# Patient Record
Sex: Male | Born: 1965
Health system: Southern US, Community
[De-identification: ages and names within clinical notes are randomized; demographics above are authoritative.]

## PROBLEM LIST (undated history)

## (undated) DIAGNOSIS — K529 Noninfective gastroenteritis and colitis, unspecified: Secondary | ICD-10-CM

## (undated) DIAGNOSIS — I7090 Unspecified atherosclerosis: Secondary | ICD-10-CM

## (undated) DIAGNOSIS — Z87442 Personal history of urinary calculi: Secondary | ICD-10-CM

## (undated) DIAGNOSIS — R7303 Prediabetes: Secondary | ICD-10-CM

## (undated) DIAGNOSIS — T7840XA Allergy, unspecified, initial encounter: Secondary | ICD-10-CM

## (undated) DIAGNOSIS — K219 Gastro-esophageal reflux disease without esophagitis: Secondary | ICD-10-CM

## (undated) DIAGNOSIS — E785 Hyperlipidemia, unspecified: Secondary | ICD-10-CM

## (undated) HISTORY — PX: LASIK: SHX215

## (undated) HISTORY — DX: Unspecified atherosclerosis: I70.90

## (undated) HISTORY — DX: Allergy, unspecified, initial encounter: T78.40XA

## (undated) HISTORY — DX: Hyperlipidemia, unspecified: E78.5

## (undated) HISTORY — PX: HEMORRHOID SURGERY: SHX153

---

## 2007-03-27 DIAGNOSIS — E782 Mixed hyperlipidemia: Secondary | ICD-10-CM | POA: Insufficient documentation

## 2011-06-18 DIAGNOSIS — E785 Hyperlipidemia, unspecified: Secondary | ICD-10-CM | POA: Insufficient documentation

## 2013-10-17 LAB — HEPATIC FUNCTION PANEL
ALT: 30 U/L (ref 10–40)
AST: 29 U/L (ref 14–40)
Alkaline Phosphatase: 103 U/L (ref 25–125)

## 2013-10-17 LAB — BASIC METABOLIC PANEL
BUN: 7 mg/dL (ref 4–21)
CREATININE: 1 mg/dL (ref 0.6–1.3)
Glucose: 109 mg/dL
POTASSIUM: 4.8 mmol/L (ref 3.4–5.3)
Sodium: 143 mmol/L (ref 137–147)

## 2013-10-17 LAB — TSH: TSH: 1.83 u[IU]/mL (ref 0.41–5.90)

## 2013-10-17 LAB — CBC AND DIFFERENTIAL
HCT: 46 % (ref 41–53)
Hemoglobin: 15.7 g/dL (ref 13.5–17.5)
Neutrophils Absolute: 3 /uL
PLATELETS: 209 10*3/uL (ref 150–399)
WBC: 5.9 10^3/mL

## 2013-10-17 LAB — PSA: PSA: 0.7

## 2014-01-10 ENCOUNTER — Other Ambulatory Visit (HOSPITAL_COMMUNITY): Payer: Self-pay | Admitting: Family Medicine

## 2014-01-10 DIAGNOSIS — Z8249 Family history of ischemic heart disease and other diseases of the circulatory system: Secondary | ICD-10-CM

## 2014-01-15 ENCOUNTER — Telehealth: Payer: Self-pay | Admitting: *Deleted

## 2014-01-15 DIAGNOSIS — R079 Chest pain, unspecified: Secondary | ICD-10-CM

## 2014-01-15 NOTE — Telephone Encounter (Signed)
Richardson Dopp >','<< Less Detail',event)" href="javascript:;">More Detail >>   Josue Hector, MD   Sent: Mon January 14, 2014 10:53 AM    To: Richmond Campbell, LPN; Terrilee Files        Message     Patient was scheduled for cardiac CTA at hospital this Friday. Dr Rosanna Randy only wanted calcium score. Please call patient and arrange exercise treadmill and calcium score in our office Can put ETT on my schedule if you need to. Patients phone numbers [phone]  [phone]      Scanned Document  01/10/2014 Scanned Document   Udell Mazzocco  MRN: [id number]         Encounter Information       Provider Department Encounter # Center    01/10/2014 4:49 PM Provider Default, MD Default Location [id number]          ALL SCANNED DOCUMENTS     Encounter-Level Documents - 01/10/14:       Scan on 01/14/2014 1:43 PM by Provider Default, MDScan on 01/14/2014 1:43 PM by Provider Default, MD     Scan on 01/11/2014 8:56 AM by Provider Default, MDScan on 01/11/2014 8:56 AM by Provider Default, MD     Scan on 01/10/2014 4:49 PM by Provider Default, MDScan on 01/10/2014 4:49 PM by Provider Default, MD     Order-Level Documents:     There are no order-level documents.     Patient-Level Documents:     There are no patient-level documents.     ORDERS ENTERED  WILL FORWARD  TO SCHEDULERS./CY

## 2014-01-18 ENCOUNTER — Ambulatory Visit (HOSPITAL_COMMUNITY): Payer: Managed Care, Other (non HMO)

## 2014-01-18 ENCOUNTER — Telehealth: Payer: Self-pay | Admitting: *Deleted

## 2014-01-18 NOTE — Telephone Encounter (Signed)
LMTCB ./CY 

## 2014-01-21 NOTE — Telephone Encounter (Signed)
LMTCB ./CY 

## 2014-01-22 NOTE — Telephone Encounter (Signed)
PT NEEDS TO  RESCHEDULE  TESTS  WILL BE OUT OF  TOWN  PLEASE [place]

## 2014-01-30 ENCOUNTER — Inpatient Hospital Stay: Admission: RE | Admit: 2014-01-30 | Payer: Managed Care, Other (non HMO) | Source: Ambulatory Visit

## 2014-01-30 ENCOUNTER — Encounter: Payer: Managed Care, Other (non HMO) | Admitting: Cardiovascular Disease

## 2014-02-06 ENCOUNTER — Encounter: Payer: Self-pay | Admitting: Cardiovascular Disease

## 2014-04-17 ENCOUNTER — Other Ambulatory Visit: Payer: Self-pay | Admitting: Family Medicine

## 2014-04-17 DIAGNOSIS — Z8249 Family history of ischemic heart disease and other diseases of the circulatory system: Secondary | ICD-10-CM

## 2014-04-17 LAB — HEMOGLOBIN A1C: HEMOGLOBIN A1C: 5.7 % (ref 4.0–6.0)

## 2014-04-17 LAB — LIPID PANEL
Cholesterol: 115 mg/dL (ref 0–200)
HDL: 29 mg/dL — AB (ref 35–70)
LDL CALC: 57 mg/dL
LDL/HDL RATIO: 2
Triglycerides: 145 mg/dL (ref 40–160)

## 2014-04-26 ENCOUNTER — Ambulatory Visit (HOSPITAL_COMMUNITY)
Admission: RE | Admit: 2014-04-26 | Discharge: 2014-04-26 | Disposition: A | Discharge status: Home / Self Care | Payer: Managed Care, Other (non HMO) | Source: Ambulatory Visit | Attending: Family Medicine | Admitting: Family Medicine

## 2014-04-26 DIAGNOSIS — R911 Solitary pulmonary nodule: Secondary | ICD-10-CM | POA: Insufficient documentation

## 2014-04-26 DIAGNOSIS — Z8249 Family history of ischemic heart disease and other diseases of the circulatory system: Secondary | ICD-10-CM | POA: Insufficient documentation

## 2014-10-23 DIAGNOSIS — R7303 Prediabetes: Secondary | ICD-10-CM | POA: Insufficient documentation

## 2014-10-23 DIAGNOSIS — J309 Allergic rhinitis, unspecified: Secondary | ICD-10-CM | POA: Insufficient documentation

## 2014-10-23 DIAGNOSIS — E663 Overweight: Secondary | ICD-10-CM | POA: Insufficient documentation

## 2014-10-24 ENCOUNTER — Encounter: Payer: Self-pay | Admitting: Family Medicine

## 2014-10-24 ENCOUNTER — Ambulatory Visit (INDEPENDENT_AMBULATORY_CARE_PROVIDER_SITE_OTHER): Payer: Managed Care, Other (non HMO) | Admitting: Family Medicine

## 2014-10-24 VITALS — BP 118/72 | Temp 98.5°F | Resp 16 | Ht 69.0 in | Wt 189.0 lb

## 2014-10-24 DIAGNOSIS — R7309 Other abnormal glucose: Secondary | ICD-10-CM

## 2014-10-24 DIAGNOSIS — Z1211 Encounter for screening for malignant neoplasm of colon: Secondary | ICD-10-CM

## 2014-10-24 DIAGNOSIS — D229 Melanocytic nevi, unspecified: Secondary | ICD-10-CM

## 2014-10-24 DIAGNOSIS — Z0000 Encounter for general adult medical examination without abnormal findings: Secondary | ICD-10-CM

## 2014-10-24 DIAGNOSIS — Z23 Encounter for immunization: Secondary | ICD-10-CM

## 2014-10-24 DIAGNOSIS — R7303 Prediabetes: Secondary | ICD-10-CM

## 2014-10-24 DIAGNOSIS — E785 Hyperlipidemia, unspecified: Secondary | ICD-10-CM | POA: Diagnosis not present

## 2014-10-24 NOTE — Progress Notes (Signed)
Patient ID: Richard Adkins, male   DOB: Jun 11, 1965, 49 y.o.   MRN: [id number]       Patient: Richard Adkins, Male    DOB: March 22, 1965, 49 y.o.   MRN: [id number] Visit Date: 10/24/2014  Today's Avigayil Ton: [name], MD   Chief Complaint  Patient presents with  . Annual Exam   Subjective:    Annual physical exam Richard Adkins is a 49 y.o. male who presents today for health maintenance and complete physical. He feels well. He reports exercising occasionally. He reports he is sleeping well.  Last: Tdap- 07/19/2007  Colonoscopy- never  EKG- 09/06/2012   -----------------------------------------------------------------   Review of Systems  Constitutional: Negative.   HENT: Negative.   Eyes: Negative.   Respiratory: Negative.   Cardiovascular: Negative.   Gastrointestinal: Negative.   Endocrine: Negative.   Genitourinary: Negative.   Musculoskeletal: Negative.   Skin: Negative.   Allergic/Immunologic: Positive for environmental allergies.  Neurological: Negative.   Hematological: Negative.   Psychiatric/Behavioral: Negative.     Social History He  reports that he has never smoked. He does not have any smokeless tobacco history on file. He reports that he does not drink alcohol or use illicit drugs. Social History   Social History  . Marital Status: Married    Spouse Name: N/A  . Number of Children: N/A  . Years of Education: N/A   Social History Main Topics  . Smoking status: Never Smoker   . Smokeless tobacco: None  . Alcohol Use: No  . Drug Use: No  . Sexual Activity: Not Asked   Other Topics Concern  . None   Social History Narrative    Patient Active Problem List   Diagnosis Date Noted  . Allergic rhinitis 10/23/2014  . Borderline diabetes 10/23/2014  . Overweight 10/23/2014  . Combined fat and carbohydrate induced hyperlipemia 03/27/2007  . Family history of cardiovascular disease 11/24/2006    Past Surgical History  Procedure Laterality Date  .  Lasik    . Hemorrhoid surgery      Family History  Family Status  Relation Status Death Age  . Mother Alive   . Father Deceased 54  . Brother Alive   . Brother Alive   . Brother Deceased 47   His family history includes Diabetes in his mother; Heart attack in his brother; Heart disease in his mother; Hyperlipidemia in his brother, brother, and mother; Hypertension in his mother; Sarcoidosis in his father.    No Known Allergies  Previous Medications   ASPIRIN EC 81 MG TABLET    Take by mouth.   ATORVASTATIN (LIPITOR) 40 MG TABLET    Take by mouth.   FLUTICASONE (FLONASE) 50 MCG/ACT NASAL SPRAY    Place into the nose.   MULTIPLE VITAMIN (MULTIVITAMIN) CAPSULE    Take by mouth.   NIACIN (NIASPAN) 1000 MG CR TABLET    2 (two) times daily.    OMEGA-3 1000 MG CAPS    Take by mouth.    Patient Care Team: Jerrol Banana., MD as PCP - General (Family Medicine)     Objective:   Vitals: BP 118/72 mmHg  Temp(Src) 98.5 F (36.9 C)  Resp 16  Ht 5' 9"  (1.753 m)  Wt 189 lb (85.73 kg)  BMI 27.90 kg/m2   Physical Exam  Constitutional: He is oriented to person, place, and time. He appears well-developed and well-nourished.  HENT:  Head: Normocephalic and atraumatic.  Right Ear: External ear normal.  Left Ear: External  ear normal.  Nose: Nose normal.  Mouth/Throat: Oropharynx is clear and moist.  Eyes: Conjunctivae and EOM are normal. Pupils are equal, round, and reactive to light.  Neck: Normal range of motion. Neck supple.  Cardiovascular: Normal rate, regular rhythm, normal heart sounds and intact distal pulses.   Pulmonary/Chest: Effort normal and breath sounds normal.  Abdominal: Soft.  Genitourinary: Penis normal. No penile tenderness.  Musculoskeletal: Normal range of motion. He exhibits no edema.  Neurological: He is alert and oriented to person, place, and time.  Straight leg raise positive on the right side.  Skin: Skin is warm and dry.  Psychiatric: He has a  normal mood and affect. His behavior is normal. Judgment and thought content normal.        Assessment & Plan:     Routine Health Maintenance and Physical Exam  Exercise Activities and Dietary recommendations Goals    None      Immunization History  Administered Date(s) Administered  . Tdap 07/19/2007    Health Maintenance  Topic Date Due  . HIV Screening  06/15/1979  . COLONOSCOPY  06/15/2014  . INFLUENZA VACCINE  09/02/2014  . TETANUS/TDAP  07/18/2017      Discussed health benefits of physical activity, and encouraged him to engage in regular exercise appropriate for his age and condition.   Right sciatica--will follow for now. --------------------------------------------------------------------

## 2014-10-25 LAB — COMPREHENSIVE METABOLIC PANEL
A/G RATIO: 2.1 (ref 1.1–2.5)
ALK PHOS: 114 IU/L (ref 39–117)
ALT: 36 IU/L (ref 0–44)
AST: 28 IU/L (ref 0–40)
Albumin: 4.7 g/dL (ref 3.5–5.5)
BILIRUBIN TOTAL: 1.2 mg/dL (ref 0.0–1.2)
BUN/Creatinine Ratio: 16 (ref 9–20)
BUN: 14 mg/dL (ref 6–24)
CALCIUM: 9.4 mg/dL (ref 8.7–10.2)
CHLORIDE: 103 mmol/L (ref 97–108)
CO2: 24 mmol/L (ref 18–29)
Creatinine, Ser: 0.87 mg/dL (ref 0.76–1.27)
GFR calc Af Amer: 116 mL/min/{1.73_m2} (ref >59)
GFR, EST NON AFRICAN AMERICAN: 101 mL/min/{1.73_m2} (ref >59)
Globulin, Total: 2.2 g/dL (ref 1.5–4.5)
Glucose: 111 mg/dL — ABNORMAL HIGH (ref 65–99)
POTASSIUM: 4.7 mmol/L (ref 3.5–5.2)
Sodium: 145 mmol/L — ABNORMAL HIGH (ref 134–144)
Total Protein: 6.9 g/dL (ref 6.0–8.5)

## 2014-10-25 LAB — CBC WITH DIFFERENTIAL/PLATELET
BASOS: 0 %
Basophils Absolute: 0 10*3/uL (ref 0.0–0.2)
EOS (ABSOLUTE): 0.2 10*3/uL (ref 0.0–0.4)
Eos: 2 %
Hematocrit: 46.9 % (ref 37.5–51.0)
Hemoglobin: 16 g/dL (ref 12.6–17.7)
IMMATURE GRANULOCYTES: 0 %
Immature Grans (Abs): 0 10*3/uL (ref 0.0–0.1)
Lymphocytes Absolute: 2.6 10*3/uL (ref 0.7–3.1)
Lymphs: 35 %
MCH: 30.8 pg (ref 26.6–33.0)
MCHC: 34.1 g/dL (ref 31.5–35.7)
MCV: 90 fL (ref 79–97)
MONOS ABS: 0.5 10*3/uL (ref 0.1–0.9)
Monocytes: 7 %
NEUTROS PCT: 56 %
Neutrophils Absolute: 4 10*3/uL (ref 1.4–7.0)
PLATELETS: 190 10*3/uL (ref 150–379)
RBC: 5.2 x10E6/uL (ref 4.14–5.80)
RDW: 14.1 % (ref 12.3–15.4)
WBC: 7.3 10*3/uL (ref 3.4–10.8)

## 2014-10-25 LAB — TSH: TSH: 1.86 u[IU]/mL (ref 0.450–4.500)

## 2014-10-25 LAB — HEMOGLOBIN A1C
Est. average glucose Bld gHb Est-mCnc: 120 mg/dL
HEMOGLOBIN A1C: 5.8 % — AB (ref 4.8–5.6)

## 2014-10-25 LAB — LIPID PANEL
CHOL/HDL RATIO: 4.5 ratio (ref 0.0–5.0)
CHOLESTEROL TOTAL: 138 mg/dL (ref 100–199)
HDL: 31 mg/dL — ABNORMAL LOW (ref >39)
LDL CALC: 80 mg/dL (ref 0–99)
Triglycerides: 136 mg/dL (ref 0–149)
VLDL CHOLESTEROL CAL: 27 mg/dL (ref 5–40)

## 2014-10-25 LAB — PSA: PROSTATE SPECIFIC AG, SERUM: 0.7 ng/mL (ref 0.0–4.0)

## 2014-10-28 ENCOUNTER — Telehealth: Payer: Self-pay

## 2014-10-28 NOTE — Telephone Encounter (Signed)
-----   Message from Jerrol Banana., MD sent at 10/28/2014  8:19 AM EDT ----- Labs stable

## 2014-10-28 NOTE — Telephone Encounter (Signed)
Advised  ED 

## 2014-10-29 ENCOUNTER — Encounter: Payer: Self-pay | Admitting: Family Medicine

## 2014-10-29 DIAGNOSIS — Z8249 Family history of ischemic heart disease and other diseases of the circulatory system: Secondary | ICD-10-CM

## 2014-11-19 NOTE — Addendum Note (Signed)
Encounter addended by: Jerrol Banana., MD on: 11/19/2014  8:00 AM<BR>     Documentation filed: Clinical Notes

## 2014-11-25 DIAGNOSIS — I2510 Atherosclerotic heart disease of native coronary artery without angina pectoris: Secondary | ICD-10-CM | POA: Insufficient documentation

## 2014-11-25 DIAGNOSIS — E782 Mixed hyperlipidemia: Secondary | ICD-10-CM | POA: Insufficient documentation

## 2014-12-02 ENCOUNTER — Telehealth: Payer: Self-pay | Admitting: Family Medicine

## 2014-12-02 NOTE — Telephone Encounter (Signed)
Pt stated he was referred to GI for a Colonoscopy and would rather go tho the doctor his wife sees. Pt would like to be referred to Dr. Arther Dames with [facility]. Phone# [phone] to [facility]. Please advise. Thanks TNP

## 2014-12-03 NOTE — Telephone Encounter (Signed)
Information faxed to Dr Rayann Heman.His office will contact pt

## 2014-12-16 NOTE — Telephone Encounter (Signed)
Nurse visit at Dr Jackalyn Lombard office 12-30-14 at 9:00

## 2015-02-02 DIAGNOSIS — I7090 Unspecified atherosclerosis: Secondary | ICD-10-CM

## 2015-02-02 HISTORY — DX: Unspecified atherosclerosis: I70.90

## 2015-02-13 ENCOUNTER — Other Ambulatory Visit: Payer: Self-pay | Admitting: Family Medicine

## 2015-04-28 ENCOUNTER — Ambulatory Visit (INDEPENDENT_AMBULATORY_CARE_PROVIDER_SITE_OTHER): Payer: Managed Care, Other (non HMO) | Admitting: Family Medicine

## 2015-04-28 ENCOUNTER — Encounter: Payer: Self-pay | Admitting: Family Medicine

## 2015-04-28 VITALS — BP 110/76 | HR 78 | Temp 98.3°F | Resp 14 | Wt 189.0 lb

## 2015-04-28 DIAGNOSIS — R739 Hyperglycemia, unspecified: Secondary | ICD-10-CM

## 2015-04-28 DIAGNOSIS — J309 Allergic rhinitis, unspecified: Secondary | ICD-10-CM

## 2015-04-28 DIAGNOSIS — I2510 Atherosclerotic heart disease of native coronary artery without angina pectoris: Secondary | ICD-10-CM | POA: Diagnosis not present

## 2015-04-28 DIAGNOSIS — E782 Mixed hyperlipidemia: Secondary | ICD-10-CM

## 2015-04-28 DIAGNOSIS — R7303 Prediabetes: Secondary | ICD-10-CM

## 2015-04-28 MED ORDER — NIACIN ER (ANTIHYPERLIPIDEMIC) 1000 MG PO TBCR: 1000.0000 mg | EXTENDED_RELEASE_TABLET | Freq: Every day | ORAL | Status: DC

## 2015-04-28 MED ORDER — ATORVASTATIN CALCIUM 80 MG PO TABS: 80.0000 mg | ORAL_TABLET | Freq: Every day | ORAL | Status: DC

## 2015-04-28 MED ORDER — FLUTICASONE PROPIONATE 50 MCG/ACT NA SUSP: 2.0000 | Freq: Every day | NASAL | Status: DC

## 2015-04-28 NOTE — Progress Notes (Signed)
Patient ID: Richard Adkins, male   DOB: 1966/01/13, 50 y.o.   MRN: [id number]    Subjective:  HPI  Lipid/Cholesterol, Follow-up:   Last seen for this6 months ago.  Management changes since that visit include Cardiologist increased Lipitor to 80 mg daily and cut his Niacin dosage in half. . Last Lipid Panel:    Component Value Date/Time   CHOL 138 10/24/2014 0957   CHOL 115 04/17/2014   TRIG 136 10/24/2014 0957   HDL 31* 10/24/2014 0957   HDL 29* 04/17/2014   CHOLHDL 4.5 10/24/2014 0957   LDLCALC 80 10/24/2014 0957   LDLCALC 57 04/17/2014    Risk factors for vascular disease include arteriosclerotic heart disease and hypercholesterolemia  He reports good compliance with treatment. He is not having side effects.  Current symptoms include none and have been unchanged. Weight trend: stable Current exercise: 1-2 times a week.  Wt Readings from Last 3 Encounters:  04/28/15 189 lb (85.73 kg)  10/24/14 189 lb (85.73 kg)  04/17/14 185 lb (83.915 kg)    ------------------------------------------------------------------- He will need refills on all his medication today.   Prior to Admission medications   Medication Sig Start Date End Date Taking? Authorizing Provider  aspirin EC 81 MG tablet Take by mouth.   Yes Historical Provider, MD  atorvastatin (LIPITOR) 80 MG tablet Take by mouth. 11/25/14 11/25/15 Yes Historical Provider, MD  fluticasone (FLONASE) 50 MCG/ACT nasal spray Place into the nose. 04/17/14  Yes Historical Provider, MD  Multiple Vitamin (MULTIVITAMIN) capsule Take by mouth.   Yes Historical Provider, MD  niacin (NIASPAN) 1000 MG CR tablet  11/25/14  Yes Historical Provider, MD  Omega-3 1000 MG CAPS Take by mouth.   Yes Historical Provider, MD    Patient Active Problem List   Diagnosis Date Noted  . Arteriosclerosis of coronary artery 11/25/2014  . Allergic rhinitis 10/23/2014  . Borderline diabetes 10/23/2014  . Overweight 10/23/2014  . Combined fat and  carbohydrate induced hyperlipemia 03/27/2007  . Family history of cardiovascular disease 11/24/2006    History reviewed. No pertinent past medical history.  Social History   Social History  . Marital Status: Married    Spouse Name: N/A  . Number of Children: N/A  . Years of Education: N/A   Occupational History  . Not on file.   Social History Main Topics  . Smoking status: Never Smoker   . Smokeless tobacco: Not on file  . Alcohol Use: No  . Drug Use: No  . Sexual Activity: Not on file   Other Topics Concern  . Not on file   Social History Narrative    No Known Allergies  Review of Systems  Constitutional: Negative.   HENT: Negative.   Eyes: Negative.   Respiratory: Negative.   Cardiovascular: Negative.   Gastrointestinal: Negative.   Genitourinary: Negative.   Musculoskeletal: Negative.   Skin: Negative.   Neurological: Negative.   Endo/Heme/Allergies: Negative.   Psychiatric/Behavioral: Negative.     Immunization History  Administered Date(s) Administered  . Influenza,inj,Quad PF,36+ Mos 10/24/2014  . Tdap 07/19/2007   Objective:  BP 110/76 mmHg  Pulse 78  Temp(Src) 98.3 F (36.8 C) (Oral)  Resp 14  Wt 189 lb (85.73 kg)  Physical Exam  Constitutional: He is oriented to person, place, and time and well-developed, well-nourished, and in no distress.  HENT:  Head: Normocephalic and atraumatic.  Right Ear: External ear normal.  Left Ear: External ear normal.  Nose: Nose normal.  Eyes: Conjunctivae and EOM  are normal. Pupils are equal, round, and reactive to light.  Neck: Normal range of motion. Neck supple.  Cardiovascular: Normal rate, regular rhythm, normal heart sounds and intact distal pulses.   Pulmonary/Chest: Effort normal and breath sounds normal.  Abdominal: Soft.  Musculoskeletal: Normal range of motion.  Neurological: He is alert and oriented to person, place, and time. He has normal reflexes. Gait normal. GCS score is 15.  Skin:  Skin is warm and dry.  Psychiatric: Mood, memory, affect and judgment normal.    Lab Results  Component Value Date   WBC 7.3 10/24/2014   HGB 15.7 10/17/2013   HCT 46.9 10/24/2014   PLT 190 10/24/2014   GLUCOSE 111* 10/24/2014   CHOL 138 10/24/2014   TRIG 136 10/24/2014   HDL 31* 10/24/2014   LDLCALC 80 10/24/2014   TSH 1.860 10/24/2014   PSA 0.7 10/17/2013   HGBA1C 5.8* 10/24/2014    CMP     Component Value Date/Time   NA 145* 10/24/2014 0957   K 4.7 10/24/2014 0957   CL 103 10/24/2014 0957   CO2 24 10/24/2014 0957   GLUCOSE 111* 10/24/2014 0957   BUN 14 10/24/2014 0957   CREATININE 0.87 10/24/2014 0957   CREATININE 1.0 10/17/2013   CALCIUM 9.4 10/24/2014 0957   PROT 6.9 10/24/2014 0957   ALBUMIN 4.7 10/24/2014 0957   AST 28 10/24/2014 0957   ALT 36 10/24/2014 0957   ALKPHOS 114 10/24/2014 0957   BILITOT 1.2 10/24/2014 0957   GFRNONAA 101 10/24/2014 0957   GFRAA 116 10/24/2014 0957    Assessment and Plan :  1. Combined fat and carbohydrate induced hyperlipemia  - CBC with Differential/Platelet - Lipid Panel With LDL/HDL Ratio - Comprehensive metabolic panel - atorvastatin (LIPITOR) 80 MG tablet; Take 1 tablet (80 mg total) by mouth daily.  Dispense: 90 tablet; Refill: 3 - niacin (NIASPAN) 1000 MG CR tablet; Take 1 tablet (1,000 mg total) by mouth daily.  Dispense: 90 tablet; Refill: 3  2.family history of CAD/coronary CT scan scores about 159/patient asymptomatic. Discussed this with cardiology and we'll aggressively treat all risk factors. Lipitor increased to maximum dose. Goal LDL less than 70 - Lipid Panel With LDL/HDL Ratio - Comprehensive metabolic panel  3. Borderline diabetes  - Hemoglobin A1c - TSH  4. Allergic rhinitis, unspecified allergic rhinitis type  - fluticasone (FLONASE) 50 MCG/ACT nasal spray; Place 2 sprays into both nostrils daily.  Dispense: 48 g; Refill: 3  5. Hyperglycemia  - TSH  Patient was seen and examined by Dr.  Miguel Aschoff, and noted scribed by Webb Laws, CMA I have done the exam and reviewed the above chart and it is accurate to the best of my knowledge.  Miguel Aschoff MD [facility] 04/28/2015 8:46 AM

## 2015-05-01 LAB — COMPREHENSIVE METABOLIC PANEL
ALBUMIN: 4.5 g/dL (ref 3.5–5.5)
ALK PHOS: 116 IU/L (ref 39–117)
ALT: 42 IU/L (ref 0–44)
AST: 50 IU/L — AB (ref 0–40)
Albumin/Globulin Ratio: 2 (ref 1.2–2.2)
BILIRUBIN TOTAL: 1.7 mg/dL — AB (ref 0.0–1.2)
BUN / CREAT RATIO: 16 (ref 9–20)
BUN: 14 mg/dL (ref 6–24)
CHLORIDE: 99 mmol/L (ref 96–106)
CO2: 25 mmol/L (ref 18–29)
Calcium: 9.1 mg/dL (ref 8.7–10.2)
Creatinine, Ser: 0.9 mg/dL (ref 0.76–1.27)
GFR calc Af Amer: 115 mL/min/{1.73_m2} (ref >59)
GFR calc non Af Amer: 99 mL/min/{1.73_m2} (ref >59)
GLOBULIN, TOTAL: 2.2 g/dL (ref 1.5–4.5)
GLUCOSE: 101 mg/dL — AB (ref 65–99)
Potassium: 4.9 mmol/L (ref 3.5–5.2)
SODIUM: 140 mmol/L (ref 134–144)
Total Protein: 6.7 g/dL (ref 6.0–8.5)

## 2015-05-01 LAB — CBC WITH DIFFERENTIAL/PLATELET
BASOS: 0 %
Basophils Absolute: 0 10*3/uL (ref 0.0–0.2)
EOS (ABSOLUTE): 0.1 10*3/uL (ref 0.0–0.4)
EOS: 2 %
HEMATOCRIT: 45.9 % (ref 37.5–51.0)
Hemoglobin: 15.5 g/dL (ref 12.6–17.7)
IMMATURE GRANULOCYTES: 0 %
Immature Grans (Abs): 0 10*3/uL (ref 0.0–0.1)
LYMPHS ABS: 2.3 10*3/uL (ref 0.7–3.1)
Lymphs: 38 %
MCH: 30.4 pg (ref 26.6–33.0)
MCHC: 33.8 g/dL (ref 31.5–35.7)
MCV: 90 fL (ref 79–97)
MONOS ABS: 0.5 10*3/uL (ref 0.1–0.9)
Monocytes: 9 %
NEUTROS ABS: 3.1 10*3/uL (ref 1.4–7.0)
Neutrophils: 51 %
PLATELETS: 215 10*3/uL (ref 150–379)
RBC: 5.1 x10E6/uL (ref 4.14–5.80)
RDW: 14 % (ref 12.3–15.4)
WBC: 6 10*3/uL (ref 3.4–10.8)

## 2015-05-01 LAB — LIPID PANEL WITH LDL/HDL RATIO
CHOLESTEROL TOTAL: 120 mg/dL (ref 100–199)
HDL: 31 mg/dL — ABNORMAL LOW (ref >39)
LDL CALC: 68 mg/dL (ref 0–99)
LDl/HDL Ratio: 2.2 ratio units (ref 0.0–3.6)
Triglycerides: 104 mg/dL (ref 0–149)
VLDL CHOLESTEROL CAL: 21 mg/dL (ref 5–40)

## 2015-05-01 LAB — HEMOGLOBIN A1C
Est. average glucose Bld gHb Est-mCnc: 120 mg/dL
Hgb A1c MFr Bld: 5.8 % — ABNORMAL HIGH (ref 4.8–5.6)

## 2015-05-01 LAB — TSH: TSH: 2.1 u[IU]/mL (ref 0.450–4.500)

## 2015-05-02 ENCOUNTER — Telehealth: Payer: Self-pay

## 2015-05-02 NOTE — Telephone Encounter (Signed)
Advised patient of results.  

## 2015-05-02 NOTE — Telephone Encounter (Signed)
-----   Message from Jerrol Banana., MD sent at 05/01/2015  8:09 AM EDT ----- Labs okay. Lipids better.

## 2015-05-02 NOTE — Telephone Encounter (Signed)
Left message to call back  

## 2015-05-14 ENCOUNTER — Encounter: Payer: Self-pay | Admitting: Family Medicine

## 2015-06-18 ENCOUNTER — Other Ambulatory Visit: Payer: Self-pay | Admitting: Family Medicine

## 2015-08-08 ENCOUNTER — Telehealth: Payer: Self-pay | Admitting: Family Medicine

## 2015-08-08 DIAGNOSIS — Z1211 Encounter for screening for malignant neoplasm of colon: Secondary | ICD-10-CM

## 2015-08-08 NOTE — Telephone Encounter (Signed)
Pt stated that he was supposed to get a colonoscopy September 2016 but the Dr. He had wanted to see moved out of the practice. Pt would like to get a referral to have a colonoscopy done wherever Dr. Rosanna Randy would recommend. Please advise. Thanks TNP

## 2015-08-11 NOTE — Telephone Encounter (Signed)
Please advise 

## 2015-08-14 NOTE — Telephone Encounter (Signed)
May need to consider a GI in [place].

## 2015-08-20 ENCOUNTER — Telehealth: Payer: Self-pay

## 2015-08-20 ENCOUNTER — Other Ambulatory Visit: Payer: Self-pay

## 2015-08-20 MED ORDER — NA SULFATE-K SULFATE-MG SULF 17.5-3.13-1.6 GM/177ML PO SOLN: 1.0000 | Freq: Once | ORAL | Status: DC

## 2015-08-20 NOTE — Telephone Encounter (Signed)
Gastroenterology Pre-Procedure Review  Request Date: 09/26/2015 Requesting Physician:   PATIENT REVIEW QUESTIONS: The patient responded to the following health history questions as indicated:    1. Are you having any GI issues? no 2. Do you have a personal history of Polyps? no 3. Do you have a family history of Colon Cancer or Polyps? no 4. Diabetes Mellitus? no 5. Joint replacements in the past 12 months?no 6. Major health problems in the past 3 months?no 7. Any artificial heart valves, MVP, or defibrillator?no    MEDICATIONS & ALLERGIES:    Patient reports the following regarding taking any anticoagulation/antiplatelet therapy:   Plavix, Coumadin, Eliquis, Xarelto, Lovenox, Pradaxa, Brilinta, or Effient? no Aspirin? yes (Heart health)  Patient confirms/reports the following medications:  Current Outpatient Prescriptions  Medication Sig Dispense Refill  . aspirin EC 81 MG tablet Take by mouth.    Marland Kitchen atorvastatin (LIPITOR) 80 MG tablet Take 1 tablet (80 mg total) by mouth daily. 90 tablet 3  . fluticasone (FLONASE) 50 MCG/ACT nasal spray Place 2 sprays into both nostrils daily. 48 g 3  . Multiple Vitamin (MULTIVITAMIN) capsule Take by mouth.    . niacin (NIASPAN) 1000 MG CR tablet TAKE 2 TABLETS BY MOUTH EVERY DAY WITH DINNER 180 tablet 2  . Omega-3 1000 MG CAPS Take by mouth.     No current facility-administered medications for this visit.    Patient confirms/reports the following allergies:  No Known Allergies  No orders of the defined types were placed in this encounter.    AUTHORIZATION INFORMATION Primary Insurance: 1D#: Group #:  Secondary Insurance: 1D#: Group #:  SCHEDULE INFORMATION: Date: 09/26/2015 Time: Location: [facility]

## 2015-09-24 ENCOUNTER — Encounter: Payer: Self-pay | Admitting: *Deleted

## 2015-09-25 NOTE — Discharge Instructions (Signed)

## 2015-09-26 ENCOUNTER — Ambulatory Visit
Admission: RE | Admit: 2015-09-26 | Discharge: 2015-09-26 | Disposition: A | Discharge status: Home / Self Care | Payer: Managed Care, Other (non HMO) | Source: Ambulatory Visit | Attending: Gastroenterology | Admitting: Gastroenterology

## 2015-09-26 ENCOUNTER — Ambulatory Visit: Payer: Managed Care, Other (non HMO) | Admitting: Anesthesiology

## 2015-09-26 ENCOUNTER — Encounter
Admission: RE | Disposition: A | Discharge status: Home / Self Care | Payer: Self-pay | Source: Ambulatory Visit | Attending: Gastroenterology

## 2015-09-26 DIAGNOSIS — D124 Benign neoplasm of descending colon: Secondary | ICD-10-CM | POA: Insufficient documentation

## 2015-09-26 DIAGNOSIS — Z833 Family history of diabetes mellitus: Secondary | ICD-10-CM | POA: Insufficient documentation

## 2015-09-26 DIAGNOSIS — Z7982 Long term (current) use of aspirin: Secondary | ICD-10-CM | POA: Diagnosis not present

## 2015-09-26 DIAGNOSIS — K6289 Other specified diseases of anus and rectum: Secondary | ICD-10-CM | POA: Insufficient documentation

## 2015-09-26 DIAGNOSIS — Z1211 Encounter for screening for malignant neoplasm of colon: Secondary | ICD-10-CM | POA: Insufficient documentation

## 2015-09-26 DIAGNOSIS — Z8249 Family history of ischemic heart disease and other diseases of the circulatory system: Secondary | ICD-10-CM | POA: Insufficient documentation

## 2015-09-26 DIAGNOSIS — Z8719 Personal history of other diseases of the digestive system: Secondary | ICD-10-CM | POA: Diagnosis not present

## 2015-09-26 DIAGNOSIS — Z79899 Other long term (current) drug therapy: Secondary | ICD-10-CM | POA: Diagnosis not present

## 2015-09-26 DIAGNOSIS — K621 Rectal polyp: Secondary | ICD-10-CM | POA: Insufficient documentation

## 2015-09-26 HISTORY — PX: POLYPECTOMY: SHX5525

## 2015-09-26 HISTORY — PX: COLONOSCOPY WITH PROPOFOL: SHX5780

## 2015-09-26 SURGERY — COLONOSCOPY WITH PROPOFOL
Anesthesia: Monitor Anesthesia Care | Wound class: Contaminated

## 2015-09-26 MED ORDER — SODIUM CHLORIDE 0.9 % IV SOLN: INTRAVENOUS | Status: DC

## 2015-09-26 MED ORDER — STERILE WATER FOR IRRIGATION IR SOLN
Status: DC | PRN
  Administered 2015-09-26: 08:00:00

## 2015-09-26 MED ORDER — PROPOFOL 10 MG/ML IV BOLUS
INTRAVENOUS | Status: DC | PRN
Start: 2015-09-26 — End: 2015-09-26
  Administered 2015-09-26: 50 mg via INTRAVENOUS
  Administered 2015-09-26: 100 mg via INTRAVENOUS
  Administered 2015-09-26 (×4): 50 mg via INTRAVENOUS

## 2015-09-26 MED ORDER — LACTATED RINGERS IV SOLN
INTRAVENOUS | Status: DC
  Administered 2015-09-26: 08:00:00 via INTRAVENOUS

## 2015-09-26 MED ORDER — LIDOCAINE HCL (CARDIAC) 20 MG/ML IV SOLN
INTRAVENOUS | Status: DC | PRN
  Administered 2015-09-26: 40 mg via INTRAVENOUS

## 2015-09-26 SURGICAL SUPPLY — 23 items
CANISTER SUCT 1200ML W/VALVE (MISCELLANEOUS) ×4 IMPLANT
CLIP HMST 235XBRD CATH ROT (MISCELLANEOUS) IMPLANT
CLIP RESOLUTION 360 11X235 (MISCELLANEOUS)
FCP ESCP3.2XJMB 240X2.8X (MISCELLANEOUS)
FORCEPS BIOP RAD 4 LRG CAP 4 (CUTTING FORCEPS) ×4 IMPLANT
FORCEPS BIOP RJ4 240 W/NDL (MISCELLANEOUS)
FORCEPS ESCP3.2XJMB 240X2.8X (MISCELLANEOUS) IMPLANT
GOWN CVR UNV OPN BCK APRN NK (MISCELLANEOUS) ×4 IMPLANT
GOWN ISOL THUMB LOOP REG UNIV (MISCELLANEOUS) ×4
INJECTOR VARIJECT VIN23 (MISCELLANEOUS) IMPLANT
KIT DEFENDO VALVE AND CONN (KITS) IMPLANT
KIT ENDO PROCEDURE OLY (KITS) ×4 IMPLANT
MARKER SPOT ENDO TATTOO 5ML (MISCELLANEOUS) IMPLANT
PAD GROUND ADULT SPLIT (MISCELLANEOUS) IMPLANT
PROBE APC STR FIRE (PROBE) IMPLANT
RETRIEVER NET ROTH 2.5X230 LF (MISCELLANEOUS) ×4 IMPLANT
SNARE SHORT THROW 13M SML OVAL (MISCELLANEOUS) IMPLANT
SNARE SHORT THROW 30M LRG OVAL (MISCELLANEOUS) IMPLANT
SNARE SNG USE RND 15MM (INSTRUMENTS) IMPLANT
SPOT EX ENDOSCOPIC TATTOO (MISCELLANEOUS)
TRAP ETRAP POLY (MISCELLANEOUS) IMPLANT
VARIJECT INJECTOR VIN23 (MISCELLANEOUS)
WATER STERILE IRR 250ML POUR (IV SOLUTION) ×4 IMPLANT

## 2015-09-26 NOTE — H&P (Signed)
  Lucilla Lame, MD [address] [place], [place] [address] Phone: [phone] Fax : [phone]  Primary Care Physician:  [name], MD Primary Gastroenterologist:  Dr. Allen Norris  Pre-Procedure History & Physical: HPI:  Richard Adkins is a 50 y.o. male is here for a screening colonoscopy.   History reviewed. No pertinent past medical history.  Past Surgical History:  Procedure Laterality Date  . HEMORRHOID SURGERY    . LASIK      Prior to Admission medications   Medication Sig Start Date End Date Taking? Authorizing Provider  aspirin EC 81 MG tablet Take by mouth.   Yes Historical Provider, MD  atorvastatin (LIPITOR) 80 MG tablet Take 1 tablet (80 mg total) by mouth daily. 04/28/15 04/27/16 Yes Richard L Cranford Mon., MD  fluticasone [facility]) 50 MCG/ACT nasal spray Place 2 sprays into both nostrils daily. 04/28/15  Yes Richard Maceo Pro., MD  Multiple Vitamin (MULTIVITAMIN) capsule Take by mouth.   Yes Historical Provider, MD  Na Sulfate-K Sulfate-Mg Sulf (SUPREP BOWEL PREP KIT) 17.5-3.13-1.6 GM/180ML SOLN Take 1 Dose by mouth once. 08/20/15  Yes Lucilla Lame, MD  niacin (NIASPAN) 1000 MG CR tablet TAKE 2 TABLETS BY MOUTH EVERY DAY WITH DINNER 06/19/15  Yes Richard Maceo Pro., MD  Omega-3 1000 MG CAPS Take by mouth.   Yes Historical Provider, MD  Probiotic Product (PROBIOTIC DAILY PO) Take by mouth.   Yes Historical Provider, MD    Allergies as of 08/20/2015  . (No Known Allergies)    Family History  Problem Relation Age of Onset  . Hyperlipidemia Mother   . Hypertension Mother   . Diabetes Mother   . Heart disease Mother   . Sarcoidosis Father   . Hyperlipidemia Brother   . Hyperlipidemia Brother   . Heart attack Brother     Social History   Social History  . Marital status: Married    Spouse name: N/A  . Number of children: N/A  . Years of education: N/A   Occupational History  . Not on file.   Social History Main Topics  . Smoking  status: Never Smoker  . Smokeless tobacco: Never Used  . Alcohol use Not on file  . Drug use: No  . Sexual activity: Not on file   Other Topics Concern  . Not on file   Social History Narrative  . No narrative on file    Review of Systems: See HPI, otherwise negative ROS  Physical Exam: BP 130/87   Pulse 79   Temp 98.7 F (37.1 C) (Temporal)   Resp 16   Ht 5' 9" (1.753 m)   Wt 182 lb (82.6 kg)   SpO2 98%   BMI 26.88 kg/m  General:   Alert,  pleasant and cooperative in NAD Head:  Normocephalic and atraumatic. Neck:  Supple; no masses or thyromegaly. Lungs:  Clear throughout to auscultation.    Heart:  Regular rate and rhythm. Abdomen:  Soft, nontender and nondistended. Normal bowel sounds, without guarding, and without rebound.   Neurologic:  Alert and  oriented x4;  grossly normal neurologically.  Impression/Plan: Richard Adkins is now here to undergo a screening colonoscopy.  Risks, benefits, and alternatives regarding colonoscopy have been reviewed with the patient.  Questions have been answered.  All parties agreeable.

## 2015-09-26 NOTE — Anesthesia Preprocedure Evaluation (Signed)
Anesthesia Evaluation  Patient identified by MRN, date of birth, ID band Patient awake    Airway Mallampati: I  TM Distance: >3 FB Neck ROM: Full    Dental   Pulmonary neg pulmonary ROS,    Pulmonary exam normal        Cardiovascular negative cardio ROS Normal cardiovascular exam     Neuro/Psych    GI/Hepatic   Endo/Other    Renal/GU      Musculoskeletal   Abdominal   Peds  Hematology   Anesthesia Other Findings   Reproductive/Obstetrics                             Anesthesia Physical Anesthesia Plan  ASA: II  Anesthesia Plan: MAC   Post-op Pain Management:    Induction: Intravenous  Airway Management Planned: Natural Airway  Additional Equipment:   Intra-op Plan:   Post-operative Plan:   Informed Consent: I have reviewed the patients History and Physical, chart, labs and discussed the procedure including the risks, benefits and alternatives for the proposed anesthesia with the patient or authorized representative who has indicated his/her understanding and acceptance.     Plan Discussed with: CRNA  Anesthesia Plan Comments:         Anesthesia Quick Evaluation

## 2015-09-26 NOTE — Anesthesia Postprocedure Evaluation (Signed)
Anesthesia Post Note  Patient: Richard Adkins  Procedure(s) Performed: Procedure(s) (LRB): COLONOSCOPY WITH PROPOFOL (N/A) POLYPECTOMY  Patient location during evaluation: PACU Anesthesia Type: MAC Level of consciousness: awake and alert Pain management: pain level controlled Vital Signs Assessment: post-procedure vital signs reviewed and stable Respiratory status: spontaneous breathing, nonlabored ventilation, respiratory function stable and patient connected to nasal cannula oxygen Cardiovascular status: stable and blood pressure returned to baseline Anesthetic complications: no    Marshell Levan

## 2015-09-26 NOTE — Op Note (Signed)
[facility] Gastroenterology Patient Name: Richard Adkins Procedure Date: 09/26/2015 8:15 AM MRN: [id number] Account #: [id number] Date of Birth: 1965-04-09 Admit Type: Outpatient Age: 50 Room: [facility] OR ROOM 01 Gender: Male Note Status: Finalized Procedure:            Colonoscopy Indications:          Screening for colorectal malignant neoplasm Providers:            Lucilla Lame MD, MD Referring MD:         Janine Ores. Rosanna Randy, MD (Referring MD) Medicines:            Propofol per Anesthesia Complications:        No immediate complications. Procedure:            Pre-Anesthesia Assessment:                       - Prior to the procedure, a History and Physical was                        performed, and patient medications and allergies were                        reviewed. The patient's tolerance of previous                        anesthesia was also reviewed. The risks and benefits of                        the procedure and the sedation options and risks were                        discussed with the patient. All questions were                        answered, and informed consent was obtained. Prior                        Anticoagulants: The patient has taken no previous                        anticoagulant or antiplatelet agents. ASA Grade                        Assessment: II - A patient with mild systemic disease.                        After reviewing the risks and benefits, the patient was                        deemed in satisfactory condition to undergo the                        procedure.                       After obtaining informed consent, the colonoscope was                        passed under direct vision. Throughout the procedure,  the patient's blood pressure, pulse, and oxygen                        saturations were monitored continuously. The Olympus CF                        H180AL colonoscope (S#: [id number]) was introduced  through                        the anus and advanced to the the cecum, identified by                        appendiceal orifice and ileocecal valve. The                        colonoscopy was performed without difficulty. The                        patient tolerated the procedure well. The quality of                        the bowel preparation was excellent. Findings:      The perianal and digital rectal examinations were normal.      A 2 mm polyp was found in the descending colon. The polyp was sessile.       The polyp was removed with a cold biopsy forceps. Resection and       retrieval were complete.      A 3 mm polyp was found in the rectum. The polyp was sessile. The polyp       was removed with a cold biopsy forceps. Resection and retrieval were       complete.      A 9 mm polyp was found in the rectum. The polyp was semi-pedunculated.       This was biopsied with a cold forceps for histology.      Localized moderate inflammation characterized by aphthous ulcerations       was found in the rectum. Biopsies were taken with a cold forceps for       histology. Impression:           - One 2 mm polyp in the descending colon, removed with                        a cold biopsy forceps. Resected and retrieved.                       - One 3 mm polyp in the rectum, removed with a cold                        biopsy forceps. Resected and retrieved.                       - One 9 mm polyp in the rectum. Biopsied.                       - Localized moderate inflammation was found in the                        rectum secondary to proctitis. Biopsied. Recommendation:       -  Await pathology results.                       - Repeat colonoscopy in 5 years if polyp adenoma and 10                        years if hyperplastic Procedure Code(s):    --- Professional ---                       [phone], Colonoscopy, flexible; with biopsy, single or                        multiple Diagnosis Code(s):    ---  Professional ---                       Z12.11, Encounter for screening for malignant neoplasm                        of colon                       D12.4, Benign neoplasm of descending colon                       K62.1, Rectal polyp CPT copyright 2016 American Medical Association. All rights reserved. The codes documented in this report are preliminary and upon coder review may  be revised to meet current compliance requirements. Lucilla Lame MD, MD 09/26/2015 8:41:13 AM This report has been signed electronically. Number of Addenda: 0 Note Initiated On: 09/26/2015 8:15 AM Scope Withdrawal Time: 0 hours 10 minutes 16 seconds  Total Procedure Duration: 0 hours 13 minutes 48 seconds       [facility]

## 2015-09-26 NOTE — Transfer of Care (Signed)
Immediate Anesthesia Transfer of Care Note  Patient: Richard Adkins  Procedure(s) Performed: Procedure(s) with comments: COLONOSCOPY WITH PROPOFOL (N/A) - requests early POLYPECTOMY  Patient Location: PACU  Anesthesia Type: MAC  Level of Consciousness: awake, alert  and patient cooperative  Airway and Oxygen Therapy: Patient Spontanous Breathing and Patient connected to supplemental oxygen  Post-op Assessment: Post-op Vital signs reviewed, Patient's Cardiovascular Status Stable, Respiratory Function Stable, Patent Airway and No signs of Nausea or vomiting  Post-op Vital Signs: Reviewed and stable  Complications: No apparent anesthesia complications

## 2015-09-26 NOTE — Anesthesia Procedure Notes (Signed)
Procedure Name: MAC Performed by: Amanuel Sinkfield Pre-anesthesia Checklist: Patient identified, Emergency Drugs available, Suction available, Timeout performed and Patient being monitored Patient Re-evaluated:Patient Re-evaluated prior to inductionOxygen Delivery Method: Nasal cannula Placement Confirmation: positive ETCO2     

## 2015-09-29 ENCOUNTER — Encounter: Payer: Self-pay | Admitting: Gastroenterology

## 2015-09-30 ENCOUNTER — Encounter: Payer: Self-pay | Admitting: Gastroenterology

## 2015-10-02 ENCOUNTER — Telehealth: Payer: Self-pay

## 2015-10-02 NOTE — Telephone Encounter (Signed)
Pt scheduled for a follow up appt with Dr. Allen Norris on Wednesday, Sept 6th.

## 2015-10-02 NOTE — Telephone Encounter (Signed)
-----   Message from Lucilla Lame, MD sent at 10/02/2015 12:13 PM EDT ----- Please have the patient come in for a follow up.

## 2015-10-08 ENCOUNTER — Encounter: Payer: Self-pay | Admitting: Gastroenterology

## 2015-10-08 ENCOUNTER — Ambulatory Visit (INDEPENDENT_AMBULATORY_CARE_PROVIDER_SITE_OTHER): Payer: Managed Care, Other (non HMO) | Admitting: Gastroenterology

## 2015-10-08 VITALS — BP 128/87 | HR 76 | Temp 98.3°F | Ht 69.0 in | Wt 188.0 lb

## 2015-10-08 DIAGNOSIS — K5120 Ulcerative (chronic) proctitis without complications: Secondary | ICD-10-CM | POA: Diagnosis not present

## 2015-10-08 NOTE — Progress Notes (Signed)
   Primary Care Physician: [name], MD  Primary Gastroenterologist:  Dr. Lucilla Lame  Chief Complaint  Patient presents with  . Follow-up    Discuss Colonoscopy results    HPI: Richard Adkins is a 50 y.o. male here for follow-up after having a colonoscopy. The patient was having a screening colonoscopy although he reports that he has intermittent bowel problems throughout his life. The patient was found to have ulcerative proctitis without any abnormalities and the rest the colon.  Current Outpatient Prescriptions  Medication Sig Dispense Refill  . aspirin EC 81 MG tablet Take by mouth.    Marland Kitchen aspirin EC 81 MG tablet Take by mouth.    Marland Kitchen atorvastatin (LIPITOR) 80 MG tablet Take 1 tablet (80 mg total) by mouth daily. 90 tablet 3  . DOCOSAHEXAENOIC ACID PO Take by mouth.    . fluticasone (FLONASE) 50 MCG/ACT nasal spray Place 2 sprays into both nostrils daily. 48 g 3  . Multiple Vitamin (MULTIVITAMIN) capsule Take by mouth.    . Multiple Vitamin (MULTIVITAMIN) capsule Take by mouth.    . Na Sulfate-K Sulfate-Mg Sulf (SUPREP BOWEL PREP KIT) 17.5-3.13-1.6 GM/180ML SOLN Take 1 Dose by mouth once. 1 Bottle 0  . niacin (NIASPAN) 1000 MG CR tablet TAKE 2 TABLETS BY MOUTH EVERY DAY WITH DINNER 180 tablet 2  . niacin (NIASPAN) 1000 MG CR tablet daily with snack at bedtime.    . Omega-3 1000 MG CAPS Take by mouth.    . Probiotic Product (PROBIOTIC DAILY PO) Take by mouth.     No current facility-administered medications for this visit.     Allergies as of 10/08/2015  . (No Known Allergies)    ROS:  General: Negative for anorexia, weight loss, fever, chills, fatigue, weakness. ENT: Negative for hoarseness, difficulty swallowing , nasal congestion. CV: Negative for chest pain, angina, palpitations, dyspnea on exertion, peripheral edema.  Respiratory: Negative for dyspnea at rest, dyspnea on exertion, cough, sputum, wheezing.  GI: See history of present illness. GU:  Negative for  dysuria, hematuria, urinary incontinence, urinary frequency, nocturnal urination.  Endo: Negative for unusual weight change.    Physical Examination:   BP 128/87 (BP Location: Right Arm, Patient Position: Sitting, Cuff Size: Normal)   Pulse 76   Temp 98.3 F (36.8 C) (Oral)   Ht _0  (1.753 m)   Wt 188 lb (85.3 kg)   BMI 27.76 kg/m   General: Well-nourished, well-developed in no acute distress.  Eyes: No icterus. Conjunctivae pink. Neuro: Alert and oriented x 3.  Grossly intact. Skin: Warm and dry, no jaundice.   Psych: Alert and cooperative, normal mood and affect.  Labs:    Imaging Studies: No results found.  Assessment and Plan:   Richard Adkins is a 50 y.o. y/o male who was found to have ulcerative proctitis with chronic inflammation on his screening colonoscopy. The patient will be given samples of Canasa for the next 3 weeks to be taken once per day. The patient has been explained to use them right before he goes to sleep. The patient will inform me if he feels any difference. The patient will need a repeat flexible sigmoidoscopy in 5 years for surveillance.   Note: This dictation was prepared with Dragon dictation along with smaller phrase technology. Any transcriptional errors that result from this process are unintentional.

## 2015-10-27 ENCOUNTER — Encounter: Payer: Managed Care, Other (non HMO) | Admitting: Family Medicine

## 2015-12-11 ENCOUNTER — Encounter: Payer: Managed Care, Other (non HMO) | Admitting: Family Medicine

## 2016-01-30 ENCOUNTER — Other Ambulatory Visit: Payer: Self-pay

## 2016-01-30 ENCOUNTER — Telehealth: Payer: Self-pay | Admitting: Gastroenterology

## 2016-01-30 MED ORDER — MESALAMINE 1000 MG RE SUPP: 1000.0000 mg | Freq: Every day | RECTAL | 3 refills | Status: DC

## 2016-01-30 NOTE — Telephone Encounter (Signed)
Patient needs a RX for Canasa 1086m. He is beginning to have a problem again. [facility] [address] [facility]  [phone] phone [phone]

## 2016-01-30 NOTE — Telephone Encounter (Signed)
Pt notified rx was sent to [place], [place].

## 2016-02-16 ENCOUNTER — Other Ambulatory Visit: Payer: Self-pay | Admitting: Family Medicine

## 2016-02-16 DIAGNOSIS — E782 Mixed hyperlipidemia: Secondary | ICD-10-CM

## 2016-02-16 MED ORDER — ATORVASTATIN CALCIUM 80 MG PO TABS: 80.0000 mg | ORAL_TABLET | Freq: Every day | ORAL | 3 refills | Status: DC

## 2016-02-16 NOTE — Telephone Encounter (Signed)
Pt contacted office for refill request on the following medications:  atorvastatin (LIPITOR) 80 MG tablet.  [facility] [address] [facility].  CB# [phone]

## 2016-02-16 NOTE — Telephone Encounter (Signed)
Done-aa

## 2016-03-03 ENCOUNTER — Ambulatory Visit (INDEPENDENT_AMBULATORY_CARE_PROVIDER_SITE_OTHER): Payer: Managed Care, Other (non HMO) | Admitting: Family Medicine

## 2016-03-03 ENCOUNTER — Encounter: Payer: Self-pay | Admitting: Family Medicine

## 2016-03-03 VITALS — BP 118/68 | HR 74 | Temp 98.8°F | Resp 16 | Ht 69.0 in | Wt 182.0 lb

## 2016-03-03 DIAGNOSIS — Z0000 Encounter for general adult medical examination without abnormal findings: Secondary | ICD-10-CM

## 2016-03-03 DIAGNOSIS — R7303 Prediabetes: Secondary | ICD-10-CM

## 2016-03-03 DIAGNOSIS — E782 Mixed hyperlipidemia: Secondary | ICD-10-CM

## 2016-03-03 DIAGNOSIS — Z23 Encounter for immunization: Secondary | ICD-10-CM | POA: Diagnosis not present

## 2016-03-03 DIAGNOSIS — Z125 Encounter for screening for malignant neoplasm of prostate: Secondary | ICD-10-CM | POA: Diagnosis not present

## 2016-03-03 LAB — POCT URINALYSIS DIPSTICK
Bilirubin, UA: NEGATIVE
Blood, UA: NEGATIVE
GLUCOSE UA: NEGATIVE
KETONES UA: NEGATIVE
LEUKOCYTES UA: NEGATIVE
Nitrite, UA: NEGATIVE
Protein, UA: NEGATIVE
SPEC GRAV UA: 1.025
UROBILINOGEN UA: 0.2
pH, UA: 6

## 2016-03-03 NOTE — Progress Notes (Signed)
Patient: Richard Adkins, Male    DOB: 05/15/1965, 51 y.o.   MRN: [id number] Visit Date: 03/03/2016  Today's Provider: [name], MD   Chief Complaint  Patient presents with  . Annual Exam   Subjective:    Annual physical exam Richard Adkins is a 51 y.o. male who presents today for health maintenance and complete physical. He feels well. He reports exercising occasionally. He reports he is sleeping well. Sleeps on average 6-8 hours. Patient doing well. He has children that are now 56 and 14. Colonoscopy- 09/26/2015. Hyperplastic polyps. Repeat in 5 years.      Review of Systems  Constitutional: Negative.   HENT: Negative.   Eyes: Negative.   Respiratory: Negative.   Cardiovascular: Negative.   Gastrointestinal: Negative.   Endocrine: Negative.   Genitourinary: Negative.   Musculoskeletal: Negative.   Skin: Negative.   Allergic/Immunologic: Negative.   Neurological: Negative.   Hematological: Negative.   Psychiatric/Behavioral: Negative.     Social History      He  reports that he has never smoked. He has never used smokeless tobacco. He reports that he does not use drugs.       Social History   Social History  . Marital status: Married    Spouse name: N/A  . Number of children: N/A  . Years of education: N/A   Social History Main Topics  . Smoking status: Never Smoker  . Smokeless tobacco: Never Used  . Alcohol use None  . Drug use: No  . Sexual activity: Not Asked   Other Topics Concern  . None   Social History Narrative  . None    Past Medical History:  Diagnosis Date  . Allergy   . Arterioloscleroses 2017  . Hyperlipidemia      Patient Active Problem List   Diagnosis Date Noted  . Arteriosclerosis of coronary artery 11/25/2014  . Mixed hyperlipidemia 11/25/2014  . Allergic rhinitis 10/23/2014  . Borderline diabetes 10/23/2014  . Overweight 10/23/2014  . Dyslipidemia 06/18/2011  . Combined fat and carbohydrate induced  hyperlipemia 03/27/2007  . Family history of cardiovascular disease 11/24/2006    Past Surgical History:  Procedure Laterality Date  . COLONOSCOPY WITH PROPOFOL N/A 09/26/2015   Procedure: COLONOSCOPY WITH PROPOFOL;  Surgeon: Lucilla Lame, MD;  Location: [place];  Service: Endoscopy;  Laterality: N/A;  requests early  . HEMORRHOID SURGERY    . LASIK    . POLYPECTOMY  09/26/2015   Procedure: POLYPECTOMY;  Surgeon: Lucilla Lame, MD;  Location: [place];  Service: Endoscopy;;    Family History        Family Status  Relation Status  . Mother Alive  . Father Deceased at age 80  . Brother Alive  . Brother Alive  . Brother Deceased at age 52        His family history includes Diabetes in his mother; Heart attack in his brother; Heart disease in his mother; Hyperlipidemia in his brother, brother, and mother; Hypertension in his mother; Sarcoidosis in his father.     No Known Allergies   Current Outpatient Prescriptions:  .  aspirin EC 81 MG tablet, Take by mouth., Disp: , Rfl:  .  atorvastatin (LIPITOR) 80 MG tablet, Take 1 tablet (80 mg total) by mouth daily., Disp: 90 tablet, Rfl: 3 .  fluticasone (FLONASE) 50 MCG/ACT nasal spray, Place 2 sprays into both nostrils daily., Disp: 48 g, Rfl: 3 .  Multiple Vitamin (MULTIVITAMIN) capsule,  Take by mouth., Disp: , Rfl:  .  niacin (NIASPAN) 1000 MG CR tablet, TAKE 2 TABLETS BY MOUTH EVERY DAY WITH DINNER, Disp: 180 tablet, Rfl: 2 .  niacin (NIASPAN) 1000 MG CR tablet, daily with snack at bedtime., Disp: , Rfl:  .  Omega-3 1000 MG CAPS, Take by mouth., Disp: , Rfl:  .  Probiotic Product (PROBIOTIC DAILY PO), Take by mouth., Disp: , Rfl:  .  aspirin EC 81 MG tablet, Take by mouth., Disp: , Rfl:  .  DOCOSAHEXAENOIC ACID PO, Take by mouth., Disp: , Rfl:  .  mesalamine (CANASA) 1000 MG suppository, Place 1 suppository (1,000 mg total) rectally at bedtime., Disp: 30 suppository, Rfl: 3 .  Multiple Vitamin (MULTIVITAMIN)  capsule, Take by mouth., Disp: , Rfl:  .  Na Sulfate-K Sulfate-Mg Sulf (SUPREP BOWEL PREP KIT) 17.5-3.13-1.6 GM/180ML SOLN, Take 1 Dose by mouth once., Disp: 1 Bottle, Rfl: 0   Patient Care Team: Richard Adkins., MD as PCP - General (Family Medicine)      Objective:   Vitals: BP 118/68 (BP Location: Right Arm, Patient Position: Sitting, Cuff Size: Normal)   Pulse 74   Temp 98.8 F (37.1 C)   Resp 16   Ht 5' 9" (1.753 m)   Wt 182 lb (82.6 kg)   BMI 26.88 kg/m    Physical Exam  Constitutional: He is oriented to person, place, and time. He appears well-developed and well-nourished.  HENT:  Head: Normocephalic and atraumatic.  Right Ear: External ear normal.  Left Ear: External ear normal.  Nose: Nose normal.  Mouth/Throat: Oropharynx is clear and moist.  Eyes: Conjunctivae and EOM are normal. Pupils are equal, round, and reactive to light.  Neck: Normal range of motion. Neck supple.  Cardiovascular: Normal rate, regular rhythm and normal heart sounds.   Pulmonary/Chest: Effort normal and breath sounds normal.  Abdominal: Soft. Bowel sounds are normal.  Musculoskeletal: Normal range of motion.  Neurological: He is alert and oriented to person, place, and time.  Skin: Skin is warm and dry.  Psychiatric: He has a normal mood and affect. His behavior is normal. Judgment and thought content normal.     Depression Screen No flowsheet data found.    Assessment & Plan:     Routine Health Maintenance and Physical Exam  Exercise Activities and Dietary recommendations Goals    None      Immunization History  Administered Date(s) Administered  . Influenza,inj,Quad PF,36+ Mos 10/24/2014  . Tdap 07/19/2007    Health Maintenance  Topic Date Due  . HIV Screening  06/15/1979  . INFLUENZA VACCINE  09/02/2015  . TETANUS/TDAP  07/18/2017  . COLONOSCOPY  09/25/2025     Discussed health benefits of physical activity, and encouraged him to engage in regular exercise  appropriate for his age and condition.  Colonoscopy last year revealed an ulcerative proctitis Hyperlipidemia--cut back on niacin to 1000 mg daily and increase atorvastatin 80 mg daily.       Richard Gilbert Jr, MD  [facility]  

## 2016-03-04 LAB — COMPREHENSIVE METABOLIC PANEL
A/G RATIO: 1.9 (ref 1.2–2.2)
ALT: 41 IU/L (ref 0–44)
AST: 32 IU/L (ref 0–40)
Albumin: 4.8 g/dL (ref 3.5–5.5)
Alkaline Phosphatase: 114 IU/L (ref 39–117)
BUN/Creatinine Ratio: 18 (ref 9–20)
BUN: 16 mg/dL (ref 6–24)
Bilirubin Total: 1.9 mg/dL — ABNORMAL HIGH (ref 0.0–1.2)
CALCIUM: 9.5 mg/dL (ref 8.7–10.2)
CO2: 28 mmol/L (ref 18–29)
Chloride: 99 mmol/L (ref 96–106)
Creatinine, Ser: 0.9 mg/dL (ref 0.76–1.27)
GFR, EST AFRICAN AMERICAN: 114 mL/min/{1.73_m2} (ref >59)
GFR, EST NON AFRICAN AMERICAN: 99 mL/min/{1.73_m2} (ref >59)
GLOBULIN, TOTAL: 2.5 g/dL (ref 1.5–4.5)
Glucose: 107 mg/dL — ABNORMAL HIGH (ref 65–99)
POTASSIUM: 4.9 mmol/L (ref 3.5–5.2)
SODIUM: 142 mmol/L (ref 134–144)
Total Protein: 7.3 g/dL (ref 6.0–8.5)

## 2016-03-04 LAB — CBC WITH DIFFERENTIAL/PLATELET
BASOS: 0 %
Basophils Absolute: 0 10*3/uL (ref 0.0–0.2)
EOS (ABSOLUTE): 0.1 10*3/uL (ref 0.0–0.4)
EOS: 2 %
HEMATOCRIT: 47.2 % (ref 37.5–51.0)
Hemoglobin: 15.8 g/dL (ref 13.0–17.7)
IMMATURE GRANULOCYTES: 0 %
Immature Grans (Abs): 0 10*3/uL (ref 0.0–0.1)
LYMPHS ABS: 2.1 10*3/uL (ref 0.7–3.1)
Lymphs: 34 %
MCH: 30.6 pg (ref 26.6–33.0)
MCHC: 33.5 g/dL (ref 31.5–35.7)
MCV: 92 fL (ref 79–97)
MONOS ABS: 0.6 10*3/uL (ref 0.1–0.9)
Monocytes: 9 %
NEUTROS ABS: 3.5 10*3/uL (ref 1.4–7.0)
NEUTROS PCT: 55 %
Platelets: 219 10*3/uL (ref 150–379)
RBC: 5.16 x10E6/uL (ref 4.14–5.80)
RDW: 13.8 % (ref 12.3–15.4)
WBC: 6.3 10*3/uL (ref 3.4–10.8)

## 2016-03-04 LAB — HEMOGLOBIN A1C
Est. average glucose Bld gHb Est-mCnc: 117 mg/dL
HEMOGLOBIN A1C: 5.7 % — AB (ref 4.8–5.6)

## 2016-03-04 LAB — LIPID PANEL
CHOL/HDL RATIO: 4.5 ratio (ref 0.0–5.0)
Cholesterol, Total: 135 mg/dL (ref 100–199)
HDL: 30 mg/dL — ABNORMAL LOW (ref >39)
LDL Calculated: 66 mg/dL (ref 0–99)
TRIGLYCERIDES: 195 mg/dL — AB (ref 0–149)
VLDL Cholesterol Cal: 39 mg/dL (ref 5–40)

## 2016-03-04 LAB — PSA: PROSTATE SPECIFIC AG, SERUM: 0.6 ng/mL (ref 0.0–4.0)

## 2016-03-04 LAB — TSH: TSH: 1.81 u[IU]/mL (ref 0.450–4.500)

## 2016-03-04 NOTE — Progress Notes (Signed)
Advised  ED 

## 2016-07-05 ENCOUNTER — Other Ambulatory Visit: Payer: Self-pay | Admitting: Family Medicine

## 2016-07-05 MED ORDER — FLUTICASONE PROPIONATE 50 MCG/ACT NA SUSP: 2.0000 | Freq: Every day | NASAL | 12 refills | Status: DC

## 2016-07-05 NOTE — Telephone Encounter (Signed)
Pt contacted office for refill request on the following medications:  fluticasone (FLONASE) 50 MCG/ACT nasal spray.  [facility].  SF#[phone]  This is a pt of Dr Wilmon Arms

## 2016-08-15 ENCOUNTER — Other Ambulatory Visit: Payer: Self-pay | Admitting: Family Medicine

## 2017-02-15 ENCOUNTER — Other Ambulatory Visit: Payer: Self-pay | Admitting: Family Medicine

## 2017-02-15 DIAGNOSIS — E782 Mixed hyperlipidemia: Secondary | ICD-10-CM

## 2017-02-15 MED ORDER — ATORVASTATIN CALCIUM 80 MG PO TABS: 80.0000 mg | ORAL_TABLET | Freq: Every day | ORAL | 3 refills | Status: DC

## 2017-02-15 NOTE — Telephone Encounter (Signed)
[facility] supply for the following medication. Thanks [facility]  atorvastatin (LIPITOR) 80 MG tablet

## 2017-02-19 ENCOUNTER — Other Ambulatory Visit: Payer: Self-pay | Admitting: Family Medicine

## 2017-02-19 DIAGNOSIS — E782 Mixed hyperlipidemia: Secondary | ICD-10-CM

## 2017-03-08 ENCOUNTER — Encounter: Payer: Self-pay | Admitting: Family Medicine

## 2017-03-08 ENCOUNTER — Other Ambulatory Visit: Payer: Self-pay

## 2017-03-08 ENCOUNTER — Ambulatory Visit (INDEPENDENT_AMBULATORY_CARE_PROVIDER_SITE_OTHER): Payer: 59 | Admitting: Family Medicine

## 2017-03-08 VITALS — BP 122/84 | HR 68 | Temp 98.4°F | Resp 16 | Ht 69.0 in | Wt 192.0 lb

## 2017-03-08 DIAGNOSIS — Z1211 Encounter for screening for malignant neoplasm of colon: Secondary | ICD-10-CM | POA: Diagnosis not present

## 2017-03-08 DIAGNOSIS — Z0000 Encounter for general adult medical examination without abnormal findings: Secondary | ICD-10-CM

## 2017-03-08 DIAGNOSIS — R7309 Other abnormal glucose: Secondary | ICD-10-CM

## 2017-03-08 DIAGNOSIS — Z125 Encounter for screening for malignant neoplasm of prostate: Secondary | ICD-10-CM

## 2017-03-08 DIAGNOSIS — J309 Allergic rhinitis, unspecified: Secondary | ICD-10-CM | POA: Diagnosis not present

## 2017-03-08 DIAGNOSIS — E782 Mixed hyperlipidemia: Secondary | ICD-10-CM

## 2017-03-08 LAB — POCT URINALYSIS DIPSTICK
BILIRUBIN UA: NEGATIVE
Blood, UA: NEGATIVE
Glucose, UA: NEGATIVE
KETONES UA: NEGATIVE
Leukocytes, UA: NEGATIVE
NITRITE UA: NEGATIVE
PH UA: 6 (ref 5.0–8.0)
PROTEIN UA: NEGATIVE
Spec Grav, UA: 1.01 (ref 1.010–1.025)
UROBILINOGEN UA: 0.2 U/dL

## 2017-03-08 LAB — IFOBT (OCCULT BLOOD): IFOBT: NEGATIVE

## 2017-03-08 MED ORDER — ATORVASTATIN CALCIUM 80 MG PO TABS: 80.0000 mg | ORAL_TABLET | Freq: Every day | ORAL | 3 refills | Status: DC

## 2017-03-08 MED ORDER — ICOSAPENT ETHYL 1 G PO CAPS: 2.0000 g | ORAL_CAPSULE | Freq: Two times a day (BID) | ORAL | 12 refills | Status: DC

## 2017-03-08 MED ORDER — FLUTICASONE PROPIONATE 50 MCG/ACT NA SUSP: 2.0000 | Freq: Every day | NASAL | 12 refills | Status: DC

## 2017-03-08 MED ORDER — NIACIN ER (ANTIHYPERLIPIDEMIC) 1000 MG PO TBCR: EXTENDED_RELEASE_TABLET | ORAL | 3 refills | Status: DC

## 2017-03-08 NOTE — Progress Notes (Signed)
Patient: Richard Adkins, Male    DOB: Sep 10, 1965, 52 y.o.   MRN: [id number] Visit Date: 03/08/2017  Today's Provider: [name], MD   Chief Complaint  Patient presents with  . Annual Exam   Subjective:  Richard Adkins is a 52 y.o. male who presents today for health maintenance and complete physical. He feels well. He reports exercising none. He reports he is sleeping well.   Immunization History  Administered Date(s) Administered  . Influenza,inj,Quad PF,6+ Mos 12/11/2012, 10/17/2013, 10/24/2014, 03/03/2016  . Influenza-Unspecified 11/01/2016  . Tdap 07/19/2007   09/26/15 Colonoscopy, Dr. Amado Coe polyp, polypoid colonic mucosa with lymphoid aggregate, mild active chronic proctatitis-f/u 10 years.   Review of Systems  Constitutional: Negative.   HENT: Negative.   Eyes: Negative.   Respiratory: Negative.   Cardiovascular: Negative.   Gastrointestinal: Negative.   Endocrine: Negative.   Genitourinary: Negative.   Musculoskeletal: Negative.   Skin: Negative.   Allergic/Immunologic: Negative.   Neurological: Negative.   Hematological: Negative.   Psychiatric/Behavioral: Negative.     Social History   Socioeconomic History  . Marital status: Married    Spouse name: Not on file  . Number of children: Not on file  . Years of education: Not on file  . Highest education level: Not on file  Social Needs  . Financial resource strain: Not on file  . Food insecurity - worry: Not on file  . Food insecurity - inability: Not on file  . Transportation needs - medical: Not on file  . Transportation needs - non-medical: Not on file  Occupational History  . Not on file  Tobacco Use  . Smoking status: Never Smoker  . Smokeless tobacco: Never Used  Substance and Sexual Activity  . Alcohol use: Not on file  . Drug use: No  . Sexual activity: Not on file  Other Topics Concern  . Not on file  Social History Narrative  . Not on file    Patient Active Problem List    Diagnosis Date Noted  . Arteriosclerosis of coronary artery 11/25/2014  . Mixed hyperlipidemia 11/25/2014  . Allergic rhinitis 10/23/2014  . Borderline diabetes 10/23/2014  . Overweight 10/23/2014  . Dyslipidemia 06/18/2011  . Combined fat and carbohydrate induced hyperlipemia 03/27/2007  . Family history of cardiovascular disease 11/24/2006    Past Surgical History:  Procedure Laterality Date  . COLONOSCOPY WITH PROPOFOL N/A 09/26/2015   Procedure: COLONOSCOPY WITH PROPOFOL;  Surgeon: Lucilla Lame, MD;  Location: [place];  Service: Endoscopy;  Laterality: N/A;  requests early  . HEMORRHOID SURGERY    . LASIK    . POLYPECTOMY  09/26/2015   Procedure: POLYPECTOMY;  Surgeon: Lucilla Lame, MD;  Location: [place];  Service: Endoscopy;;    His family history includes Diabetes in his mother; Heart attack in his brother; Heart disease in his mother; Hyperlipidemia in his brother, brother, and mother; Hypertension in his mother; Sarcoidosis in his father.     Outpatient Encounter Medications as of 03/08/2017  Medication Sig Note  . aspirin EC 81 MG tablet Take by mouth. 10/08/2015: Received from: [place]: Take 81 mg by mouth daily.    Marland Kitchen atorvastatin (LIPITOR) 80 MG tablet Take 1 tablet (80 mg total) by mouth daily.   . fluticasone (FLONASE) 50 MCG/ACT nasal spray Place 2 sprays into both nostrils daily.   . mesalamine (CANASA) 1000 MG suppository Place 1 suppository (1,000 mg total) rectally at bedtime.   . Multiple Vitamin (MULTIVITAMIN)  capsule Take by mouth. 10/08/2015: Received from: [place]: Take 1 capsule by mouth daily.    . niacin (NIASPAN) 1000 MG CR tablet TAKE 2 TABLETS BY MOUTH EVERY DAY WITH DINNER   . Probiotic Product (PROBIOTIC DAILY PO) Take by mouth.   . [DISCONTINUED] atorvastatin (LIPITOR) 80 MG tablet Take 1 tablet (80 mg total) by mouth daily.   . [DISCONTINUED] fluticasone (FLONASE) 50  MCG/ACT nasal spray Place 2 sprays into both nostrils daily.   . [DISCONTINUED] niacin (NIASPAN) 1000 MG CR tablet TAKE 2 TABLETS BY MOUTH EVERY DAY WITH DINNER   . [DISCONTINUED] Omega-3 1000 MG CAPS Take by mouth. 10/23/2014: Received from: [place]  . Icosapent Ethyl 1 g CAPS Take 2 g by mouth 2 (two) times daily.   . [DISCONTINUED] aspirin EC 81 MG tablet Take by mouth. 10/23/2014: Received from: [place]  . [DISCONTINUED] DOCOSAHEXAENOIC ACID PO Take by mouth. 10/08/2015: Received from: [place]: Take by mouth daily. 1216m. daily   . [DISCONTINUED] Multiple Vitamin (MULTIVITAMIN) capsule Take by mouth. 10/23/2014: [place]ceived from: [place] . [DISCONTINUED] Na Sulfate-K Sulfate-Mg Sulf (SUPREP BOWEL PREP KIT) 17.5-3.13-1.6 GM/180ML SOLN Take 1 Dose by mouth once.   . [DISCONTINUED] niacin (NIASPAN) 1000 MG CR tablet TAKE 2 TABLETS BY MOUTH EVERY DAY WITH DINNER   . [DISCONTINUED] niacin (NIASPAN) 1000 MG CR tablet daily with snack at bedtime. 10/08/2015: [place]  No facility-administered encounter medications on file as of 03/08/2017.     Patient Care Team: GJerrol Banana, MD as PCP - General (Family Medicine)      Objective:   Vitals:  Vitals:   03/08/17 0850  BP: 122/84  Pulse: 68  Resp: 16  Temp: 98.4 F (36.9 C)  TempSrc: Oral  Weight: 192 lb (87.1 kg)  Height: 5' 9"  (1.753 m)    Physical Exam  Constitutional: He is oriented to person, place, and time. He appears well-developed and well-nourished.  HENT:  Head: Normocephalic and atraumatic.  Right Ear: External ear normal.  Left Ear: External ear normal.  Nose: Nose normal.  Mouth/Throat: Oropharynx is clear and moist.  Eyes: Conjunctivae and EOM are normal. Pupils are equal, round, and reactive to light.  Neck: Normal range of motion. Neck supple.  Cardiovascular: Normal rate,  regular rhythm, normal heart sounds and intact distal pulses.  Pulmonary/Chest: Effort normal and breath sounds normal.  Abdominal: Soft. Bowel sounds are normal.  Genitourinary: Rectum normal, prostate normal and penis normal.  Musculoskeletal: Normal range of motion.  Neurological: He is alert and oriented to person, place, and time.  Skin: Skin is warm and dry.  Psychiatric: He has a normal mood and affect. His behavior is normal. Judgment and thought content normal.   Fall Risk  03/08/2017  Falls in the past year? No   Depression Screen PHQ 2/9 Scores 03/08/2017 03/08/2017  PHQ - 2 Score 0 0  PHQ- 9 Score 2 -   Functional Status Survey: Is the patient deaf or have difficulty hearing?: No Does the patient have difficulty seeing, even when wearing glasses/contacts?: No Does the patient have difficulty concentrating, remembering, or making decisions?: No Does the patient have difficulty walking or climbing stairs?: No Does the patient have difficulty dressing or bathing?: No Does the patient have difficulty doing errands alone such as visiting a doctor's office or shopping?: No  Home Exercise  03/08/2017  Current Exercise Habits The patient does not participate in regular exercise at present     Assessment & Plan:     Routine Health Maintenance and Physical Exam  Exercise Activities and Dietary recommendations Goals    None      Immunization History  Administered Date(s) Administered  . Influenza,inj,Quad PF,6+ Mos 12/11/2012, 10/17/2013, 10/24/2014, 03/03/2016  . Influenza-Unspecified 11/01/2016  . Tdap 07/19/2007    Health Maintenance  Topic Date Due  . HIV Screening  06/15/1979  . INFLUENZA VACCINE  09/01/2016  . TETANUS/TDAP  07/18/2017  . COLONOSCOPY  09/25/2025     Discussed health benefits of physical activity, and encouraged him to engage in regular exercise appropriate for his age and condition.  HLD Stop OTC fish oil and try Vascepa 2 po BID. Labs 1  month.  I have done the exam and reviewed the chart and it is accurate to the best of my knowledge. Development worker, community has been used and  any errors in dictation or transcription are unintentional. Miguel Aschoff M.D. [facility]

## 2017-03-09 LAB — COMPREHENSIVE METABOLIC PANEL
A/G RATIO: 2.2 (ref 1.2–2.2)
ALT: 44 IU/L (ref 0–44)
AST: 33 IU/L (ref 0–40)
Albumin: 4.6 g/dL (ref 3.5–5.5)
Alkaline Phosphatase: 93 IU/L (ref 39–117)
BUN/Creatinine Ratio: 16 (ref 9–20)
BUN: 16 mg/dL (ref 6–24)
Bilirubin Total: 1.7 mg/dL — ABNORMAL HIGH (ref 0.0–1.2)
CALCIUM: 9.4 mg/dL (ref 8.7–10.2)
CO2: 25 mmol/L (ref 20–29)
Chloride: 101 mmol/L (ref 96–106)
Creatinine, Ser: 1 mg/dL (ref 0.76–1.27)
GFR, EST AFRICAN AMERICAN: 100 mL/min/{1.73_m2} (ref >59)
GFR, EST NON AFRICAN AMERICAN: 86 mL/min/{1.73_m2} (ref >59)
Globulin, Total: 2.1 g/dL (ref 1.5–4.5)
Glucose: 99 mg/dL (ref 65–99)
POTASSIUM: 4.4 mmol/L (ref 3.5–5.2)
Sodium: 143 mmol/L (ref 134–144)
TOTAL PROTEIN: 6.7 g/dL (ref 6.0–8.5)

## 2017-03-09 LAB — CBC WITH DIFFERENTIAL/PLATELET
BASOS ABS: 0 10*3/uL (ref 0.0–0.2)
BASOS: 0 %
EOS (ABSOLUTE): 0.1 10*3/uL (ref 0.0–0.4)
Eos: 3 %
Hematocrit: 44.1 % (ref 37.5–51.0)
Hemoglobin: 15.1 g/dL (ref 13.0–17.7)
IMMATURE GRANS (ABS): 0 10*3/uL (ref 0.0–0.1)
IMMATURE GRANULOCYTES: 0 %
LYMPHS: 38 %
Lymphocytes Absolute: 2 10*3/uL (ref 0.7–3.1)
MCH: 30.5 pg (ref 26.6–33.0)
MCHC: 34.2 g/dL (ref 31.5–35.7)
MCV: 89 fL (ref 79–97)
MONOS ABS: 0.3 10*3/uL (ref 0.1–0.9)
Monocytes: 6 %
NEUTROS PCT: 53 %
Neutrophils Absolute: 2.8 10*3/uL (ref 1.4–7.0)
PLATELETS: 205 10*3/uL (ref 150–379)
RBC: 4.95 x10E6/uL (ref 4.14–5.80)
RDW: 13.6 % (ref 12.3–15.4)
WBC: 5.3 10*3/uL (ref 3.4–10.8)

## 2017-03-09 LAB — LIPID PANEL WITH LDL/HDL RATIO
Cholesterol, Total: 123 mg/dL (ref 100–199)
HDL: 26 mg/dL — ABNORMAL LOW (ref >39)
LDL Calculated: 53 mg/dL (ref 0–99)
LDL/HDL RATIO: 2 ratio (ref 0.0–3.6)
TRIGLYCERIDES: 219 mg/dL — AB (ref 0–149)
VLDL CHOLESTEROL CAL: 44 mg/dL — AB (ref 5–40)

## 2017-03-09 LAB — HEMOGLOBIN A1C
ESTIMATED AVERAGE GLUCOSE: 123 mg/dL
Hgb A1c MFr Bld: 5.9 % — ABNORMAL HIGH (ref 4.8–5.6)

## 2017-03-09 LAB — TSH: TSH: 1.69 u[IU]/mL (ref 0.450–4.500)

## 2017-03-09 LAB — PSA: Prostate Specific Ag, Serum: 0.6 ng/mL (ref 0.0–4.0)

## 2017-03-14 ENCOUNTER — Telehealth: Payer: Self-pay

## 2017-03-14 NOTE — Telephone Encounter (Signed)
-----   Message from Jerrol Banana., MD sent at 03/10/2017  4:15 PM EST ----- Labs OK--try Vascepa as planned--

## 2017-03-14 NOTE — Telephone Encounter (Signed)
Patient advised as below.  

## 2017-03-17 ENCOUNTER — Telehealth: Payer: Self-pay | Admitting: Emergency Medicine

## 2017-03-17 DIAGNOSIS — E785 Hyperlipidemia, unspecified: Secondary | ICD-10-CM

## 2017-03-17 NOTE — Telephone Encounter (Signed)
Pt insurance is not wanting to cover the Vascepa 1 GM.  His insurance prefers generic Lovaza. Ok to switch? Please advise. Thanks.   [facility]

## 2017-03-17 NOTE — Telephone Encounter (Signed)
ok 

## 2017-03-18 MED ORDER — OMEGA-3-ACID ETHYL ESTERS 1 G PO CAPS: 1.0000 g | ORAL_CAPSULE | Freq: Two times a day (BID) | ORAL | 3 refills | Status: DC

## 2017-03-18 NOTE — Telephone Encounter (Signed)
Sent to pharmacy 

## 2017-03-25 ENCOUNTER — Encounter: Payer: Self-pay | Admitting: Family Medicine

## 2017-03-25 ENCOUNTER — Ambulatory Visit (INDEPENDENT_AMBULATORY_CARE_PROVIDER_SITE_OTHER): Payer: 59 | Admitting: Family Medicine

## 2017-03-25 VITALS — BP 118/80 | HR 84 | Temp 97.9°F | Resp 16 | Wt 193.0 lb

## 2017-03-25 DIAGNOSIS — R197 Diarrhea, unspecified: Secondary | ICD-10-CM | POA: Diagnosis not present

## 2017-03-25 MED ORDER — METRONIDAZOLE 500 MG PO TABS: 500.0000 mg | ORAL_TABLET | Freq: Two times a day (BID) | ORAL | 0 refills | Status: DC

## 2017-03-25 NOTE — Patient Instructions (Signed)
Diarrhea, Adult Diarrhea is frequent loose and watery bowel movements. Diarrhea can make you feel weak and cause you to become dehydrated. Dehydration can make you tired and thirsty, cause you to have a dry mouth, and decrease how often you urinate. Diarrhea typically lasts 2-3 days. However, it can last longer if it is a sign of something more serious. It is important to treat your diarrhea as told by your health care provider. Follow these instructions at home: Eating and drinking  Follow these recommendations as told by your health care provider:  Take an oral rehydration solution (ORS). This is a drink that is sold at pharmacies and retail stores.  Drink clear fluids, such as water, ice chips, diluted fruit juice, and low-calorie sports drinks.  Eat bland, easy-to-digest foods in small amounts as you are able. These foods include bananas, applesauce, rice, lean meats, toast, and crackers.  Avoid drinking fluids that contain a lot of sugar or caffeine, such as energy drinks, sports drinks, and soda.  Avoid alcohol.  Avoid spicy or fatty foods.  General instructions  Drink enough fluid to keep your urine clear or pale yellow.  Wash your hands often. If soap and water are not available, use hand sanitizer.  Make sure that all people in your household wash their hands well and often.  Take over-the-counter and prescription medicines only as told by your health care provider.  Rest at home while you recover.  Watch your condition for any changes.  Take a warm bath to relieve any burning or pain from frequent diarrhea episodes.  Keep all follow-up visits as told by your health care provider. This is important. Contact a health care provider if:  You have a fever.  Your diarrhea gets worse.  You have new symptoms.  You cannot keep fluids down.  You feel light-headed or dizzy.  You have a headache  You have muscle cramps. Get help right away if:  You have chest  pain.  You feel extremely weak or you faint.  You have bloody or black stools or stools that look like tar.  You have severe pain, cramping, or bloating in your abdomen.  You have trouble breathing or you are breathing very quickly.  Your heart is beating very quickly.  Your skin feels cold and clammy.  You feel confused.  You have signs of dehydration, such as: ? Dark urine, very little urine, or no urine. ? Cracked lips. ? Dry mouth. ? Sunken eyes. ? Sleepiness. ? Weakness. This information is not intended to replace advice given to you by your health care provider. Make sure you discuss any questions you have with your health care provider. Document Released: 01/08/2002 Document Revised: 05/29/2015 Document Reviewed: 09/24/2014 Elsevier Interactive Patient Education  [employer].

## 2017-03-25 NOTE — Progress Notes (Signed)
Patient: Richard Adkins Male    DOB: [birth year]   52 y.o.   MRN: [id number] Visit Date: 03/25/2017  Today's Provider: Lelon Huh, MD   Chief Complaint  Patient presents with  . Diarrhea  . Nausea   Subjective:    HPI Pt of Dr. Rosanna Randy presents stating that he started feeling bad 3 days ago. He has body aches, chills, diarrhea, nausea, abdominal cramps and weakness. No known fevers. No cough, congestion, chest pain or shortness of breath. Pt reports that he has not had a solid BM in 3 days. He has doubled up on his probiotic but it has not helped.  No recent travel. Started Lovaza a week or so ago. Feels slightly nauseated, but no vomiting. No blood in stool. Stomach is a little crampy, but not painful.      No Known Allergies   Current Outpatient Medications:  .  aspirin EC 81 MG tablet, Take by mouth., Disp: , Rfl:  .  atorvastatin (LIPITOR) 80 MG tablet, Take 1 tablet (80 mg total) by mouth daily., Disp: 90 tablet, Rfl: 3 .  fluticasone (FLONASE) 50 MCG/ACT nasal spray, Place 2 sprays into both nostrils daily., Disp: 48 g, Rfl: 12 .  mesalamine (CANASA) 1000 MG suppository, Place 1 suppository (1,000 mg total) rectally at bedtime., Disp: 30 suppository, Rfl: 3 .  Multiple Vitamin (MULTIVITAMIN) capsule, Take by mouth., Disp: , Rfl:  .  niacin (NIASPAN) 1000 MG CR tablet, TAKE 2 TABLETS BY MOUTH EVERY DAY WITH DINNER, Disp: 180 tablet, Rfl: 3 .  omega-3 acid ethyl esters (LOVAZA) 1 g capsule, Take 1 capsule (1 g total) by mouth 2 (two) times daily., Disp: 180 capsule, Rfl: 3 .  Probiotic Product (PROBIOTIC DAILY PO), Take by mouth., Disp: , Rfl:   Review of Systems  Constitutional: Positive for fatigue.  HENT: Negative.   Eyes: Negative.   Respiratory: Negative.   Cardiovascular: Negative.   Gastrointestinal: Positive for abdominal pain, diarrhea and nausea.  Endocrine: Negative.   Genitourinary: Negative.   Musculoskeletal: Negative.   Skin: Negative.     Allergic/Immunologic: Negative.   Neurological: Negative.   Hematological: Negative.   Psychiatric/Behavioral: Negative.     Social History   Tobacco Use  . Smoking status: Never Smoker  . Smokeless tobacco: Never Used  Substance Use Topics  . Alcohol use: Not on file   Objective:   BP 118/80 (BP Location: Left Arm, Patient Position: Sitting, Cuff Size: Normal)   Pulse 84   Temp 97.9 F (36.6 C) (Oral)   Resp 16   Wt 193 lb (87.5 kg)   BMI 28.50 kg/m  Vitals:   03/25/17 1540  BP: 118/80  Pulse: 84  Resp: 16  Temp: 97.9 F (36.6 C)  TempSrc: Oral  Weight: 193 lb (87.5 kg)     Physical Exam  General Appearance:    Alert, cooperative, no distress  Eyes:    PERRL, conjunctiva/corneas clear, EOM's intact       Lungs:     Clear to auscultation bilaterally, respirations unlabored  Heart:    Regular rate and rhythm  Abdomen:   bowel sounds present and normal in all 4 quadrants, soft, round, nontender or nondistended. No CVA tenderness        Assessment & Plan:     1. Diarrhea, unspecified type Persistent watery diarrhea without vomiting suggestive of lower GI infection, cover for parasitic infections and clostridium..  - metroNIDAZOLE (FLAGYL) 500 MG tablet; Take 1  tablet (500 mg total) by mouth 2 (two) times daily.  Dispense: 14 tablet; Refill: 0  Call if not much better within a week.       Lelon Huh, MD  [facility]

## 2017-04-05 ENCOUNTER — Ambulatory Visit (INDEPENDENT_AMBULATORY_CARE_PROVIDER_SITE_OTHER): Payer: 59 | Admitting: Family Medicine

## 2017-04-05 VITALS — BP 118/84 | HR 74 | Temp 98.3°F | Resp 14 | Wt 190.0 lb

## 2017-04-05 DIAGNOSIS — E785 Hyperlipidemia, unspecified: Secondary | ICD-10-CM

## 2017-04-05 NOTE — Progress Notes (Signed)
Patient: Richard Adkins Male    DOB: [birth year]   52 y.o.   MRN: [id number] Visit Date: 04/05/2017  Today's Provider: [name], MD   Chief Complaint  Patient presents with  . Hyperlipidemia   Subjective:    HPI  Lipid/Cholesterol, Follow-up:   Last seen for this1 months ago.  Management changes since that visit include started Vascepa 2 PO BID, but insurance would not cover, so Lovaza was sent in. However Pt has been taking samples of the Vascepa for the last month.   Last Lipid Panel:    Component Value Date/Time   CHOL 123 03/08/2017 0945   TRIG 219 (H) 03/08/2017 0945   HDL 26 (L) 03/08/2017 0945   CHOLHDL 4.5 03/03/2016 1024   LDLCALC 53 03/08/2017 0945   He reports good compliance with treatment. He is not having side effects.  Current exercise: none  Wt Readings from Last 3 Encounters:  04/05/17 190 lb (86.2 kg)  03/25/17 193 lb (87.5 kg)  03/08/17 192 lb (87.1 kg)    -------------------------------------------------------------------    No Known Allergies   Current Outpatient Medications:  .  aspirin EC 81 MG tablet, Take by mouth., Disp: , Rfl:  .  atorvastatin (LIPITOR) 80 MG tablet, Take 1 tablet (80 mg total) by mouth daily., Disp: 90 tablet, Rfl: 3 .  fluticasone (FLONASE) 50 MCG/ACT nasal spray, Place 2 sprays into both nostrils daily., Disp: 48 g, Rfl: 12 .  mesalamine (CANASA) 1000 MG suppository, Place 1 suppository (1,000 mg total) rectally at bedtime., Disp: 30 suppository, Rfl: 3 .  niacin (NIASPAN) 1000 MG CR tablet, TAKE 2 TABLETS BY MOUTH EVERY DAY WITH DINNER, Disp: 180 tablet, Rfl: 3 .  Probiotic Product (PROBIOTIC DAILY PO), Take by mouth., Disp: , Rfl:  .  metroNIDAZOLE (FLAGYL) 500 MG tablet, Take 1 tablet (500 mg total) by mouth 2 (two) times daily. (Patient not taking: Reported on 04/05/2017), Disp: 14 tablet, Rfl: 0 .  Multiple Vitamin (MULTIVITAMIN) capsule, Take by mouth., Disp: , Rfl:  .  omega-3 acid ethyl esters  (LOVAZA) 1 g capsule, Take 1 capsule (1 g total) by mouth 2 (two) times daily. (Patient not taking: Reported on 04/05/2017), Disp: 180 capsule, Rfl: 3  Review of Systems  Constitutional: Negative.   HENT: Negative.   Eyes: Negative.   Respiratory: Negative.   Cardiovascular: Negative.   Gastrointestinal: Negative.   Endocrine: Negative.   Genitourinary: Negative.   Musculoskeletal: Negative.   Skin: Negative.   Allergic/Immunologic: Negative.   Neurological: Negative.   Hematological: Negative.   Psychiatric/Behavioral: Negative.     Social History   Tobacco Use  . Smoking status: Never Smoker  . Smokeless tobacco: Never Used  Substance Use Topics  . Alcohol use: Not on file   Objective:   BP 118/84 (BP Location: Left Arm, Patient Position: Sitting, Cuff Size: Normal)   Pulse 74   Temp 98.3 F (36.8 C) (Oral)   Resp 14   Wt 190 lb (86.2 kg)   BMI 28.06 kg/m  Vitals:   04/05/17 0956  BP: 118/84  Pulse: 74  Resp: 14  Temp: 98.3 F (36.8 C)  TempSrc: Oral  Weight: 190 lb (86.2 kg)     Physical Exam  Constitutional: He is oriented to person, place, and time. He appears well-developed and well-nourished.  HENT:  Head: Normocephalic and atraumatic.  Eyes: Conjunctivae are normal. No scleral icterus.  Neck: No thyromegaly present.  Cardiovascular: Normal rate, regular  rhythm, normal heart sounds and intact distal pulses.  Pulmonary/Chest: Effort normal and breath sounds normal.  Abdominal: Soft.  Neurological: He is alert and oriented to person, place, and time.  Skin: Skin is warm and dry.  Psychiatric: He has a normal mood and affect. His behavior is normal. Judgment and thought content normal.        Assessment & Plan:     1. Dyslipidemia On Lovaza. - Lipid panel--RTC 3-4 months. 2.FH of CAD     I have done the exam and reviewed the above chart and it is accurate to the best of my knowledge. Development worker, community has been used in this note in any air is  in the dictation or transcription are unintentional.  [name], MD  [facility]

## 2017-04-06 ENCOUNTER — Telehealth: Payer: Self-pay

## 2017-04-06 LAB — LIPID PANEL
CHOL/HDL RATIO: 4 ratio (ref 0.0–5.0)
Cholesterol, Total: 104 mg/dL (ref 100–199)
HDL: 26 mg/dL — AB (ref >39)
LDL Calculated: 52 mg/dL (ref 0–99)
Triglycerides: 132 mg/dL (ref 0–149)
VLDL Cholesterol Cal: 26 mg/dL (ref 5–40)

## 2017-04-06 NOTE — Telephone Encounter (Signed)
LMTCB 04/06/2017  Thanks,   -Mickel Baas

## 2017-04-06 NOTE — Telephone Encounter (Signed)
Pt advised of results and agrees with plan.

## 2017-04-06 NOTE — Telephone Encounter (Signed)
-----   Message from Jerrol Banana., MD sent at 04/06/2017  8:58 AM EST ----- Richard Adkins better. Continue Fish oil Rx.

## 2017-07-05 ENCOUNTER — Telehealth: Payer: Self-pay

## 2017-07-05 NOTE — Telephone Encounter (Signed)
Called and left message for patient about form he wanted [place] to fill out.  ED

## 2017-07-08 NOTE — Telephone Encounter (Signed)
Pt advised.

## 2017-07-08 NOTE — Telephone Encounter (Signed)
Form is ready and at nurses station.  Patient will need to have measurement of waist recorded before getting form.

## 2017-08-18 ENCOUNTER — Ambulatory Visit (INDEPENDENT_AMBULATORY_CARE_PROVIDER_SITE_OTHER): Payer: 59 | Admitting: Family Medicine

## 2017-08-18 ENCOUNTER — Encounter: Payer: Self-pay | Admitting: Family Medicine

## 2017-08-18 VITALS — BP 128/82 | HR 78 | Temp 98.8°F | Resp 16 | Ht 69.0 in | Wt 191.0 lb

## 2017-08-18 DIAGNOSIS — Z23 Encounter for immunization: Secondary | ICD-10-CM | POA: Diagnosis not present

## 2017-08-18 DIAGNOSIS — E785 Hyperlipidemia, unspecified: Secondary | ICD-10-CM | POA: Diagnosis not present

## 2017-08-18 NOTE — Progress Notes (Signed)
Patient: Richard Adkins Male    DOB: 12-16-1965   52 y.o.   MRN: [id number] Visit Date: 08/18/2017  Today's Provider: [name], MD   Chief Complaint  Patient presents with  . Hyperlipidemia   Subjective:    HPI  Lipid/Cholesterol, Follow-up:   Last seen for this4 months ago.  Management changes since that visit include adding Lovaza. . Last Lipid Panel:    Component Value Date/Time   CHOL 104 04/05/2017 1041   TRIG 132 04/05/2017 1041   HDL 26 (L) 04/05/2017 1041   CHOLHDL 4.0 04/05/2017 1041   LDLCALC 52 04/05/2017 1041    He reports good compliance with treatment. He is not having side effects.  Current symptoms include none and have been stable. Weight trend: stable Prior visit with dietician: no Current diet: well balanced Current exercise: no regular exercise  Wt Readings from Last 3 Encounters:  08/18/17 191 lb (86.6 kg)  04/05/17 190 lb (86.2 kg)  03/25/17 193 lb (87.5 kg)      No Known Allergies   Current Outpatient Medications:  .  aspirin EC 81 MG tablet, Take by mouth., Disp: , Rfl:  .  fluticasone (FLONASE) 50 MCG/ACT nasal spray, Place 2 sprays into both nostrils daily., Disp: 48 g, Rfl: 12 .  Multiple Vitamin (MULTIVITAMIN) capsule, Take by mouth., Disp: , Rfl:  .  niacin (NIASPAN) 1000 MG CR tablet, TAKE 2 TABLETS BY MOUTH EVERY DAY WITH DINNER, Disp: 180 tablet, Rfl: 3 .  omega-3 acid ethyl esters (LOVAZA) 1 g capsule, Take 1 capsule (1 g total) by mouth 2 (two) times daily., Disp: 180 capsule, Rfl: 3 .  Probiotic Product (PROBIOTIC DAILY PO), Take by mouth., Disp: , Rfl:  .  atorvastatin (LIPITOR) 80 MG tablet, Take 1 tablet (80 mg total) by mouth daily., Disp: 90 tablet, Rfl: 3 .  mesalamine (CANASA) 1000 MG suppository, Place 1 suppository (1,000 mg total) rectally at bedtime. (Patient not taking: Reported on 08/18/2017), Disp: 30 suppository, Rfl: 3 .  metroNIDAZOLE (FLAGYL) 500 MG tablet, Take 1 tablet (500 mg total) by mouth  2 (two) times daily. (Patient not taking: Reported on 04/05/2017), Disp: 14 tablet, Rfl: 0  Review of Systems  Constitutional: Negative for activity change, appetite change, chills, diaphoresis, fatigue, fever and unexpected weight change.  Respiratory: Negative for cough and shortness of breath.   Cardiovascular: Negative for chest pain, palpitations and leg swelling.  Endocrine: Negative.   Neurological: Negative for light-headedness.  Psychiatric/Behavioral: Negative.     Social History   Tobacco Use  . Smoking status: Never Smoker  . Smokeless tobacco: Never Used  Substance Use Topics  . Alcohol use: Not on file   Objective:   BP 128/82 (BP Location: Left Arm, Patient Position: Sitting, Cuff Size: Normal)   Pulse 78   Temp 98.8 F (37.1 C)   Resp 16   Ht 5' 9"  (1.753 m)   Wt 191 lb (86.6 kg)   SpO2 96%   BMI 28.21 kg/m  Vitals:   08/18/17 0853  BP: 128/82  Pulse: 78  Resp: 16  Temp: 98.8 F (37.1 C)  SpO2: 96%  Weight: 191 lb (86.6 kg)  Height: 5' 9"  (1.753 m)     Physical Exam  Constitutional: He is oriented to person, place, and time. He appears well-developed and well-nourished.  HENT:  Head: Normocephalic and atraumatic.  Right Ear: External ear normal.  Left Ear: External ear normal.  Nose: Nose normal.  Eyes: Conjunctivae are normal. No scleral icterus.  Neck: No thyromegaly present.  Cardiovascular: Normal rate, regular rhythm and normal heart sounds.  Pulmonary/Chest: Effort normal and breath sounds normal.  Abdominal: Soft.  Musculoskeletal: He exhibits no edema.  Neurological: He is alert and oriented to person, place, and time.  Skin: Skin is warm and dry.  Psychiatric: He has a normal mood and affect. His behavior is normal. Judgment and thought content normal.        Assessment & Plan:     1. Dyslipidemia Stop niaspan. - Lipid panel  2. Need for Td vaccine  - Td : Tetanus/diphtheria >53yo Preservative  free  I have done the exam  and reviewed the chart and it is accurate to the best of my knowledge. Development worker, community has been used and  any errors in dictation or transcription are unintentional. Miguel Aschoff M.D. [place], MD  [facility]

## 2017-08-19 ENCOUNTER — Telehealth: Payer: Self-pay

## 2017-08-19 LAB — LIPID PANEL
CHOLESTEROL TOTAL: 137 mg/dL (ref 100–199)
Chol/HDL Ratio: 5.1 ratio — ABNORMAL HIGH (ref 0.0–5.0)
HDL: 27 mg/dL — AB (ref >39)
LDL Calculated: 70 mg/dL (ref 0–99)
Triglycerides: 201 mg/dL — ABNORMAL HIGH (ref 0–149)
VLDL CHOLESTEROL CAL: 40 mg/dL (ref 5–40)

## 2017-08-19 NOTE — Telephone Encounter (Signed)
Pt advised.   Thanks,   -Laura  

## 2017-08-19 NOTE — Telephone Encounter (Signed)
-----   Message from Jerrol Banana., MD sent at 08/19/2017 11:02 AM EDT ----- Stable.

## 2017-10-28 ENCOUNTER — Other Ambulatory Visit
Admission: RE | Admit: 2017-10-28 | Discharge: 2017-10-28 | Disposition: A | Discharge status: Home / Self Care | Payer: 59 | Source: Ambulatory Visit | Attending: Pediatrics | Admitting: Pediatrics

## 2017-10-28 DIAGNOSIS — M546 Pain in thoracic spine: Secondary | ICD-10-CM | POA: Diagnosis present

## 2017-10-28 LAB — FIBRIN DERIVATIVES D-DIMER (ARMC ONLY): Fibrin derivatives D-dimer (ARMC): 652.34 ng/mL (FEU) — ABNORMAL HIGH (ref 0.00–499.00)

## 2017-10-31 ENCOUNTER — Encounter: Payer: Self-pay | Admitting: Emergency Medicine

## 2017-10-31 ENCOUNTER — Other Ambulatory Visit: Payer: Self-pay

## 2017-10-31 ENCOUNTER — Emergency Department
Admission: EM | Admit: 2017-10-31 | Discharge: 2017-10-31 | Disposition: A | Discharge status: Home / Self Care | Payer: 59 | Attending: Emergency Medicine | Admitting: Emergency Medicine

## 2017-10-31 DIAGNOSIS — R1011 Right upper quadrant pain: Secondary | ICD-10-CM | POA: Insufficient documentation

## 2017-10-31 DIAGNOSIS — I2510 Atherosclerotic heart disease of native coronary artery without angina pectoris: Secondary | ICD-10-CM | POA: Diagnosis not present

## 2017-10-31 DIAGNOSIS — Z7982 Long term (current) use of aspirin: Secondary | ICD-10-CM | POA: Diagnosis not present

## 2017-10-31 DIAGNOSIS — R109 Unspecified abdominal pain: Secondary | ICD-10-CM

## 2017-10-31 LAB — COMPREHENSIVE METABOLIC PANEL
ALT: 44 U/L (ref 0–44)
ANION GAP: 8 (ref 5–15)
AST: 36 U/L (ref 15–41)
Albumin: 4.2 g/dL (ref 3.5–5.0)
Alkaline Phosphatase: 81 U/L (ref 38–126)
BUN: 20 mg/dL (ref 6–20)
CHLORIDE: 105 mmol/L (ref 98–111)
CO2: 27 mmol/L (ref 22–32)
Calcium: 9.2 mg/dL (ref 8.9–10.3)
Creatinine, Ser: 0.91 mg/dL (ref 0.61–1.24)
Glucose, Bld: 136 mg/dL — ABNORMAL HIGH (ref 70–99)
Potassium: 3.2 mmol/L — ABNORMAL LOW (ref 3.5–5.1)
Sodium: 140 mmol/L (ref 135–145)
Total Bilirubin: 1.8 mg/dL — ABNORMAL HIGH (ref 0.3–1.2)
Total Protein: 6.8 g/dL (ref 6.5–8.1)

## 2017-10-31 LAB — CBC WITH DIFFERENTIAL/PLATELET
BASOS ABS: 0.1 10*3/uL (ref 0–0.1)
Basophils Relative: 0 %
EOS PCT: 0 %
Eosinophils Absolute: 0 10*3/uL (ref 0–0.7)
HCT: 45.1 % (ref 40.0–52.0)
Hemoglobin: 15.7 g/dL (ref 13.0–18.0)
LYMPHS ABS: 3.6 10*3/uL (ref 1.0–3.6)
LYMPHS PCT: 28 %
MCH: 31.2 pg (ref 26.0–34.0)
MCHC: 34.8 g/dL (ref 32.0–36.0)
MCV: 89.7 fL (ref 80.0–100.0)
MONO ABS: 1.1 10*3/uL — AB (ref 0.2–1.0)
Monocytes Relative: 8 %
Neutro Abs: 8.2 10*3/uL — ABNORMAL HIGH (ref 1.4–6.5)
Neutrophils Relative %: 64 %
PLATELETS: 227 10*3/uL (ref 150–440)
RBC: 5.03 MIL/uL (ref 4.40–5.90)
RDW: 13.5 % (ref 11.5–14.5)
WBC: 13 10*3/uL — ABNORMAL HIGH (ref 3.8–10.6)

## 2017-10-31 NOTE — ED Provider Notes (Signed)
[facility] Emergency Department Provider Note   ____________________________________________   I have reviewed the triage vital signs and the nursing notes.   HISTORY  Chief Complaint Abnormal Lab   History limited by: Not Limited   HPI Richard Adkins is a 52 y.o. male who presents to the emergency department today because of elevated d-dimer that was checked at urgent care a couple of days ago.  The patient went to urgent care because of concerns for right flank pain.  Stated started that morning.  Initially it was in his right flank and radiated down his right leg like sciatica.  They became more localized in the right upper flank.  Did radiate around to his abdomen.  It was somewhat worse with deep breaths however he denies any shortness of breath or cough.  He denies any unilateral swelling.  At urgent care he was told that his x-ray showed an unusual finding although it is unclear what it was.  He was also found to have blood in his urine.  Additionally d-dimer was checked.  When this came back elevated he was instructed to come to the emergency department.  Since his visit his pain is resolved.    Per medical record review patient has a history of an urgent care visit.  Per chart review x-ray was read as no acute disease with hamartoma that had been noted on previous CT scan.  Past Medical History:  Diagnosis Date  . Allergy   . Arterioloscleroses 2017  . Hyperlipidemia     Patient Active Problem List   Diagnosis Date Noted  . Arteriosclerosis of coronary artery 11/25/2014  . Mixed hyperlipidemia 11/25/2014  . CAD (coronary artery disease) 11/25/2014  . Allergic rhinitis 10/23/2014  . Borderline diabetes 10/23/2014  . Overweight 10/23/2014  . Dyslipidemia 06/18/2011  . Combined fat and carbohydrate induced hyperlipemia 03/27/2007  . Family history of cardiovascular disease 11/24/2006    Past Surgical History:  Procedure Laterality Date  .  COLONOSCOPY WITH PROPOFOL N/A 09/26/2015   Procedure: COLONOSCOPY WITH PROPOFOL;  Surgeon: Lucilla Lame, MD;  Location: [place];  Service: Endoscopy;  Laterality: N/A;  requests early  . HEMORRHOID SURGERY    . LASIK    . POLYPECTOMY  09/26/2015   Procedure: POLYPECTOMY;  Surgeon: Lucilla Lame, MD;  Location: [place];  Service: Endoscopy;;    Prior to Admission medications   Medication Sig Start Date End Date Taking? Authorizing Provider  aspirin EC 81 MG tablet Take by mouth.    [provider]  atorvastatin (LIPITOR) 80 MG tablet Take 1 tablet (80 mg total) by mouth daily. 03/08/17 03/08/18  Jerrol Banana., MD  fluticasone (FLONASE) 50 MCG/ACT nasal spray Place 2 sprays into both nostrils daily. 03/08/17   Jerrol Banana., MD  mesalamine (CANASA) 1000 MG suppository Place 1 suppository (1,000 mg total) rectally at bedtime. Patient not taking: Reported on 08/18/2017 01/30/16   Lucilla Lame, MD  metroNIDAZOLE (FLAGYL) 500 MG tablet Take 1 tablet (500 mg total) by mouth 2 (two) times daily. Patient not taking: Reported on 04/05/2017 03/25/17   Birdie Sons, MD  Multiple Vitamin (MULTIVITAMIN) capsule Take by mouth.    [provider]  niacin (NIASPAN) 1000 MG CR tablet TAKE 2 TABLETS BY MOUTH EVERY DAY WITH DINNER 03/08/17   Jerrol Banana., MD  omega-3 acid ethyl esters (LOVAZA) 1 g capsule Take 1 capsule (1 g total) by mouth 2 (two) times daily. 03/18/17  Jerrol Banana., MD  Probiotic Product (PROBIOTIC DAILY PO) Take by mouth.    [provider]    Allergies Patient has no known allergies.  Family History  Problem Relation Age of Onset  . Hyperlipidemia Mother   . Hypertension Mother   . Diabetes Mother   . Heart disease Mother   . Sarcoidosis Father   . Hyperlipidemia Brother   . Hyperlipidemia Brother   . Heart attack Brother     Social History Social History   Tobacco Use  . Smoking status: Never  Smoker  . Smokeless tobacco: Never Used  Substance Use Topics  . Alcohol use: Not on file  . Drug use: No    Review of Systems Constitutional: No fever/chills Eyes: No visual changes. ENT: No sore throat. Cardiovascular: Denies chest pain. Respiratory: Denies shortness of breath. Gastrointestinal: Positive for right flank pain Genitourinary: Negative for dysuria. Musculoskeletal: Negative for back pain. Skin: Negative for rash. Neurological: Negative for headaches, focal weakness or numbness.  ____________________________________________   PHYSICAL EXAM:  VITAL SIGNS: ED Triage Vitals [10/31/17 1207]  Enc Vitals Group     BP (!) 151/86     Pulse Rate 71     Resp 18     Temp 98.8 F (37.1 C)     Temp Source Oral     SpO2 97 %     Weight 190 lb (86.2 kg)     Height 5' 9"  (1.753 m)     Head Circumference      Peak Flow      Pain Score 0   Constitutional: Alert and oriented.  Eyes: Conjunctivae are normal.  ENT      Head: Normocephalic and atraumatic.      Nose: No congestion/rhinnorhea.      Mouth/Throat: Mucous membranes are moist.      Neck: No stridor. Cardiovascular: Normal rate, regular rhythm.  No murmurs, rubs, or gallops.  Respiratory: Normal respiratory effort without tachypnea nor retractions. Breath sounds are clear and equal bilaterally. No wheezes/rales/rhonchi. Gastrointestinal: Soft and non tender. No rebound. No guarding.  Genitourinary: Deferred Musculoskeletal: Normal range of motion in all extremities. No lower extremity edema. Neurologic:  Normal speech and language. No gross focal neurologic deficits are appreciated.  Skin:  Skin is warm, dry and intact. No rash noted. Psychiatric: Mood and affect are normal. Speech and behavior are normal. Patient exhibits appropriate insight and judgment.  ____________________________________________    LABS (pertinent positives/negatives)  CBC wbc 13.0, hgb 15.7, plt 227 CMP wnl except k 3.2, glu 136,  t bili 1.8  ____________________________________________   EKG  None  ____________________________________________    RADIOLOGY  None  ____________________________________________   PROCEDURES  Procedures  ____________________________________________   INITIAL IMPRESSION / ASSESSMENT AND PLAN / ED COURSE  Pertinent labs & imaging results that were available during my care of the patient were reviewed by me and considered in my medical decision making (see chart for details).   Patient presented to the emergency department today because of an elevated d-dimer that was checked in urgent care couple of days ago.  In discussion with patient he has not had any shortness of breath or chest pain.  His symptoms have resolved.  Did have a discussion with the patient about obtaining a CT scan.  At this point he felt comfortable deferring.  We did discuss PE return precautions.  I do think would be low likelihood that the patient has a PE.  Do think is reasonable  to defer.  ____________________________________________   FINAL CLINICAL IMPRESSION(S) / ED DIAGNOSES  Final diagnoses:  Right flank pain     Note: This dictation was prepared with Dragon dictation. Any transcriptional errors that result from this process are unintentional     Nance Pear, MD 10/31/17 1444

## 2017-10-31 NOTE — ED Triage Notes (Signed)
States went to urgent car 3 days ago for R flank and side pain. States has slight blood in his urine and that he had slight blood in his urine. States also has d dimer done which they told him was elevated and so he needed to come here for evaluation. Denies SOB. R side pain has resolved.

## 2017-10-31 NOTE — Discharge Instructions (Addendum)
Please seek medical attention for any high fevers, chest pain, shortness of breath, change in behavior, persistent vomiting, bloody stool or any other new or concerning symptoms.  

## 2017-11-03 ENCOUNTER — Encounter: Payer: Self-pay | Admitting: Family Medicine

## 2017-11-03 ENCOUNTER — Ambulatory Visit (INDEPENDENT_AMBULATORY_CARE_PROVIDER_SITE_OTHER): Payer: 59 | Admitting: Family Medicine

## 2017-11-03 VITALS — BP 135/88 | HR 70 | Temp 98.5°F | Wt 191.6 lb

## 2017-11-03 DIAGNOSIS — R109 Unspecified abdominal pain: Secondary | ICD-10-CM | POA: Diagnosis not present

## 2017-11-03 LAB — POCT URINALYSIS DIPSTICK
Bilirubin, UA: NEGATIVE
GLUCOSE UA: NEGATIVE
KETONES UA: 15
LEUKOCYTES UA: NEGATIVE
Nitrite, UA: NEGATIVE
Protein, UA: POSITIVE — AB
Urobilinogen, UA: 0.2 E.U./dL
pH, UA: 6 (ref 5.0–8.0)

## 2017-11-03 MED ORDER — HYDROCODONE-ACETAMINOPHEN 5-325 MG PO TABS: 1.0000 | ORAL_TABLET | ORAL | 0 refills | Status: DC | PRN

## 2017-11-03 NOTE — Progress Notes (Signed)
ct      Patient: Richard Adkins Male    DOB: November 07, 1965   52 y.o.   MRN: [id number] Visit Date: 11/03/2017  Today's Provider: [name], MD   Chief Complaint  Patient presents with  . ER Follow Up- Back Pain   Subjective:    I, Tiburcio Pea CMA, am acting as a scribe for Miguel Aschoff MD.   HPI  Follow up ER visit  Patient was seen in ER for right flank pain radiating down right leg on 10/31/2017. He was treated for right flank pain. Treatment for this included prescribed Z-Pak, Flexeril and Prednisone at [facility] on 10/28/2017. Patient was advised to follow up at ER for elevated D-Dimer. Patient was seen at [facility] ER 10/31/2017. No changes made. Patient advised to follow up with PCP. He reports good compliance with treatment. He reports this condition is Unchanged. Patient states the pain subsided and restarted yesterday.  Pt has had no known injury,it is not positional,he cannot find a comfortable position. ------------------------------------------------------------------------------------  No Known Allergies   Current Outpatient Medications:  .  aspirin EC 81 MG tablet, Take by mouth., Disp: , Rfl:  .  atorvastatin (LIPITOR) 80 MG tablet, Take 1 tablet (80 mg total) by mouth daily., Disp: 90 tablet, Rfl: 3 .  cyclobenzaprine (FLEXERIL) 5 MG tablet, TAKE 1 TAB BY MOUTH 3 TIMES DAILY AS NEEDED FOR MUSCLE SPASMS (TAKE AT BEDTIME IF CAUSES DROWSINESS), Disp: , Rfl: 0 .  fluticasone (FLONASE) 50 MCG/ACT nasal spray, Place 2 sprays into both nostrils daily., Disp: 48 g, Rfl: 12 .  Multiple Vitamin (MULTIVITAMIN) capsule, Take by mouth., Disp: , Rfl:  .  niacin (NIASPAN) 1000 MG CR tablet, TAKE 2 TABLETS BY MOUTH EVERY DAY WITH DINNER, Disp: 180 tablet, Rfl: 3 .  omega-3 acid ethyl esters (LOVAZA) 1 g capsule, Take 1 capsule (1 g total) by mouth 2 (two) times daily., Disp: 180 capsule, Rfl: 3 .  Probiotic Product (PROBIOTIC DAILY PO), Take by mouth., Disp:  , Rfl:  .  mesalamine (CANASA) 1000 MG suppository, Place 1 suppository (1,000 mg total) rectally at bedtime. (Patient not taking: Reported on 08/18/2017), Disp: 30 suppository, Rfl: 3  Review of Systems  Constitutional: Negative.   Respiratory: Negative.   Cardiovascular: Negative.   Musculoskeletal: Positive for back pain.  Psychiatric/Behavioral: Negative.     Social History   Tobacco Use  . Smoking status: Never Smoker  . Smokeless tobacco: Never Used  Substance Use Topics  . Alcohol use: Not on file   Objective:   BP 135/88 (BP Location: Right Arm, Patient Position: Sitting, Cuff Size: Normal)   Pulse 70   Temp 98.5 F (36.9 C) (Oral)   Wt 191 lb 9.6 oz (86.9 kg)   BMI 28.29 kg/m  Vitals:   11/03/17 1430  BP: 135/88  Pulse: 70  Temp: 98.5 F (36.9 C)  TempSrc: Oral  Weight: 191 lb 9.6 oz (86.9 kg)     Physical Exam  Constitutional: He is oriented to person, place, and time. He appears well-developed and well-nourished.  HENT:  Head: Normocephalic and atraumatic.  Eyes: Conjunctivae are normal. No scleral icterus.  Neck: No thyromegaly present.  Cardiovascular: Normal rate, regular rhythm and normal heart sounds.  Pulmonary/Chest: Effort normal.  Abdominal: Soft.  No CVAT.  Musculoskeletal: He exhibits no edema.  LS spine exam normal.  Neurological: He is alert and oriented to person, place, and time.  Skin: Skin is warm and dry.  Psychiatric:  He has a normal mood and affect. His behavior is normal. Judgment and thought content normal.        Assessment & Plan:     1. Right flank pain I feel confident this clinically is a second kidney stone. Consider [place] if Urology referral cannot be done soon. - POCT Urinalysis Dipstick--positive for blood. - HYDROcodone-acetaminophen (NORCO/VICODIN) 5-325 MG tablet; Take 1 tablet by mouth every 4 (four) hours as needed for moderate pain.  Dispense: 25 tablet; Refill: 0 - Ambulatory referral to Urology      I  have done the exam and reviewed the above chart and it is accurate to the best of my knowledge. Development worker, community has been used in this note in any air is in the dictation or transcription are unintentional.  [name], MD  [facility]

## 2017-11-15 ENCOUNTER — Ambulatory Visit (INDEPENDENT_AMBULATORY_CARE_PROVIDER_SITE_OTHER): Payer: 59 | Admitting: Urology

## 2017-11-15 ENCOUNTER — Encounter: Payer: Self-pay | Admitting: Urology

## 2017-11-15 VITALS — BP 124/88 | HR 71 | Ht 69.0 in | Wt 190.6 lb

## 2017-11-15 DIAGNOSIS — R109 Unspecified abdominal pain: Secondary | ICD-10-CM | POA: Diagnosis not present

## 2017-11-15 DIAGNOSIS — R3129 Other microscopic hematuria: Secondary | ICD-10-CM

## 2017-11-15 LAB — MICROSCOPIC EXAMINATION: Epithelial Cells (non renal): NONE SEEN /hpf (ref 0–10)

## 2017-11-15 LAB — URINALYSIS, COMPLETE
Bilirubin, UA: NEGATIVE
GLUCOSE, UA: NEGATIVE
KETONES UA: NEGATIVE
LEUKOCYTES UA: NEGATIVE
Nitrite, UA: NEGATIVE
Urobilinogen, Ur: 1 mg/dL (ref 0.2–1.0)
pH, UA: 5.5 (ref 5.0–7.5)

## 2017-11-15 LAB — BLADDER SCAN AMB NON-IMAGING: Scan Result: 40

## 2017-11-15 NOTE — Progress Notes (Signed)
11/15/2017 9:27 AM   Richard Adkins [name] 1965-10-01 [id number]  Referring provider: Jerrol Banana., MD [address] [place], [place] [address]  Chief Complaint  Patient presents with  . Nephrolithiasis  . Flank Pain    HPI: Patient is a 52 year old Caucasian male who is referred by Dr. Eulas Post for right flank pain.  He presented to urgent care in the right flank that radiated down his buttocks to the lateral side of his leg. Pain increased during the day and then the pain was so bad that he had to leave work.  He was seen in the urgent care and given Flexeril, Zithromax and Prednisone.  The pain abated.  Patient was found to have microscopic hematuria on 10/28/2017 with 10-50 RBC's/hpf.    The pain returned a few days later in the right flank to the abdomen to the right flank to right groin.   It lasted for several hours.  He then saw Dr. Rosanna Randy and was given Norco.  The Norco gave him constipation and he switched to Aleve.    The right sided flank pain has been mild and intermittent since that time and controlled with Aleve.    He does not have a prior history of recurrent urinary tract infections, nephrolithiasis, trauma to the genitourinary tract, BPH or malignancies of the genitourinary tract.   His daughter has recently been diagnosed with nephrolithiasis.  He does not have a family history of malignancies of the genitourinary tract or hematuria.   He is not having symptoms of frequent urination, urgency, dysuria, nocturia, incontinence, hesitancy, intermittency, straining to urinate or a weak urinary stream.  Patient denies any gross hematuria, dysuria or suprapubic.  Patient denies any fevers, chills, nausea or vomiting.   His UA today demonstrates 3-10 RBC's.    He not a smoker. He is not exposed to secondhand smoke.  He has not worked with Sports administrator, trichloroethylene, etc.      PMH: Past Medical History:  Diagnosis Date  . Allergy     . Arterioloscleroses 2017  . Hyperlipidemia     Surgical History: Past Surgical History:  Procedure Laterality Date  . COLONOSCOPY WITH PROPOFOL N/A 09/26/2015   Procedure: COLONOSCOPY WITH PROPOFOL;  Surgeon: Lucilla Lame, MD;  Location: [facility];  Service: Endoscopy;  Laterality: N/A;  requests early  . HEMORRHOID SURGERY    . LASIK    . POLYPECTOMY  09/26/2015   Procedure: POLYPECTOMY;  Surgeon: Lucilla Lame, MD;  Location: [facility];  Service: Endoscopy;;    Home Medications:  Allergies as of 11/15/2017   No Known Allergies     Medication List        Accurate as of 11/15/17  9:27 AM. Always use your most recent med list.          aspirin EC 81 MG tablet Take by mouth.   atorvastatin 80 MG tablet Commonly known as:  LIPITOR Take 1 tablet (80 mg total) by mouth daily.   fluticasone 50 MCG/ACT nasal spray Commonly known as:  FLONASE Place 2 sprays into both nostrils daily.   mesalamine 1000 MG suppository Commonly known as:  CANASA Place 1 suppository (1,000 mg total) rectally at bedtime.   multivitamin capsule Take by mouth.   niacin 1000 MG CR tablet Commonly known as:  NIASPAN TAKE 2 TABLETS BY MOUTH EVERY DAY WITH DINNER   omega-3 acid ethyl esters 1 g capsule Commonly known as:  LOVAZA Take  1 capsule (1 g total) by mouth 2 (two) times daily.   PROBIOTIC DAILY PO Take by mouth.       Allergies: No Known Allergies  Family History: Family History  Problem Relation Age of Onset  . Hyperlipidemia Mother   . Hypertension Mother   . Diabetes Mother   . Heart disease Mother   . Sarcoidosis Father   . Hyperlipidemia Brother   . Hyperlipidemia Brother   . Heart attack Brother     Social History:  reports that he has never smoked. He has never used smokeless tobacco. He reports that he does not use drugs. His alcohol history is not on file.  ROS: UROLOGY Frequent Urination?: No Hard to postpone urination?: No Burning/pain  with urination?: No Get up at night to urinate?: No Leakage of urine?: No Urine stream starts and stops?: No Trouble starting stream?: No Do you have to strain to urinate?: No Blood in urine?: Yes Urinary tract infection?: No Sexually transmitted disease?: No Injury to kidneys or bladder?: No Painful intercourse?: No Weak stream?: No Erection problems?: No Penile pain?: No  Gastrointestinal Nausea?: No Vomiting?: No Indigestion/heartburn?: No Diarrhea?: No Constipation?: No  Constitutional Fever: No Night sweats?: No Weight loss?: No Fatigue?: No  Skin Skin rash/lesions?: No Itching?: No  Eyes Blurred vision?: No Double vision?: No  Ears/Nose/Throat Sore throat?: No Sinus problems?: No  Hematologic/Lymphatic Easy bruising?: No  Cardiovascular Leg swelling?: No Chest pain?: No  Respiratory Cough?: No Shortness of breath?: No  Endocrine Excessive thirst?: No  Musculoskeletal Back pain?: No Joint pain?: No  Neurological Headaches?: No Dizziness?: No  Psychologic Depression?: No Anxiety?: No  Physical Exam: BP 124/88 (BP Location: Left Arm, Patient Position: Sitting, Cuff Size: Normal)   Pulse 71   Ht 5' 9"  (1.753 m)   Wt 190 lb 9.6 oz (86.5 kg)   BMI 28.15 kg/m   Constitutional:  Well nourished. Alert and oriented, No acute distress. HEENT: [place] AT, moist mucus membranes.  Trachea midline, no masses. Cardiovascular: No clubbing, cyanosis, or edema. Respiratory: Normal respiratory effort, no increased work of breathing. GI: Abdomen is soft, non tender, non distended, no abdominal masses. Liver and spleen not palpable.  No hernias appreciated.  Stool sample for occult testing is not indicated.   GU: No CVA tenderness.  No bladder fullness or masses.  Patient with circumcised phallus.   Urethral meatus is patent.  No penile discharge. No penile lesions or rashes. Scrotum without lesions, cysts, rashes and/or edema.  Testicles are located scrotally  bilaterally. No masses are appreciated in the testicles. Left and right epididymis are normal. Rectal: Patient with  normal sphincter tone. Anus and perineum without scarring or rashes. No rectal masses are appreciated. Prostate is approximately 50 grams, no nodules are appreciated. Seminal vesicles are normal. Skin: No rashes, bruises or suspicious lesions. Lymph: No cervical or inguinal adenopathy. Neurologic: Grossly intact, no focal deficits, moving all 4 extremities. Psychiatric: Normal mood and affect.  Laboratory Data: Lab Results  Component Value Date   WBC 13.0 (H) 10/31/2017   HGB 15.7 10/31/2017   HCT 45.1 10/31/2017   MCV 89.7 10/31/2017   PLT 227 10/31/2017    Lab Results  Component Value Date   CREATININE 0.91 10/31/2017    Lab Results  Component Value Date   PSA 0.7 10/17/2013    No results found for: TESTOSTERONE  Lab Results  Component Value Date   HGBA1C 5.9 (H) 03/08/2017    Lab Results  Component  Value Date   TSH 1.690 03/08/2017       Component Value Date/Time   CHOL 137 08/18/2017 0935   HDL 27 (L) 08/18/2017 0935   CHOLHDL 5.1 (H) 08/18/2017 0935   LDLCALC 70 08/18/2017 0935    Lab Results  Component Value Date   AST 36 10/31/2017   Lab Results  Component Value Date   ALT 44 10/31/2017   No components found for: ALKALINEPHOPHATASE No components found for: BILIRUBINTOTAL  No results found for: ESTRADIOL  Urinalysis 3-10 RBC's.  See EPIC  I have reviewed the labs.   Assessment & Plan:    1. Microscopic hematuria  - I explained to the patient that there are a number of causes that can be associated with blood in the urine, such as stones, BPH, UTI's, damage to the urinary tract and/or cancer-even those his symptoms certainly sound clinically like renal colic and is important to undergo the hematuria work-up as a urological cancer may mimic renal colic  - At this time, I felt that the patient warranted further urologic  evaluation with 3 or greater RBC's/hpf on microscopic evaluation of the urine.  The AUA guidelines state that a CT urogram is the preferred imaging study to evaluate hematuria.  - I explained to the patient that a contrast material will be injected into a vein and that in rare instances, an allergic reaction can result and may even life threatening   The patient denies any allergies to contrast, iodine and/or seafood and is not taking metformin.  - Following the imaging study,  I've recommended a cystoscopy. I described how this is performed, typically in an office setting with a flexible cystoscope. We described the risks, benefits, and possible side effects, the most common of which is a minor amount of blood in the urine and/or burning which usually resolves in 24 to 48 hours.    - The patient had the opportunity to ask questions which were answered. Based upon this discussion, the patient is willing to proceed. Therefore, I've ordered: a CT Urogram and cystoscopy.  - The patient will return following all of the above for discussion of the results.   - UA  - Urine culture  - BUN + creatinine    2. Right flank pain CTU pending   Return for CT Urogram report and cystoscopy.  These notes generated with voice recognition software. I apologize for typographical errors.  Zara Council, PA-C  [facility] [address]  [place], [place] [address] [phone]

## 2017-11-21 LAB — CULTURE, URINE COMPREHENSIVE

## 2017-12-09 ENCOUNTER — Ambulatory Visit
Admission: RE | Admit: 2017-12-09 | Discharge: 2017-12-09 | Disposition: A | Discharge status: Home / Self Care | Payer: 59 | Source: Ambulatory Visit | Attending: Urology | Admitting: Urology

## 2017-12-09 DIAGNOSIS — R3129 Other microscopic hematuria: Secondary | ICD-10-CM | POA: Diagnosis present

## 2017-12-09 DIAGNOSIS — N289 Disorder of kidney and ureter, unspecified: Secondary | ICD-10-CM | POA: Insufficient documentation

## 2017-12-09 DIAGNOSIS — R109 Unspecified abdominal pain: Secondary | ICD-10-CM

## 2017-12-09 DIAGNOSIS — N132 Hydronephrosis with renal and ureteral calculous obstruction: Secondary | ICD-10-CM | POA: Insufficient documentation

## 2017-12-09 DIAGNOSIS — R911 Solitary pulmonary nodule: Secondary | ICD-10-CM | POA: Diagnosis not present

## 2017-12-09 MED ORDER — IOPAMIDOL (ISOVUE-300) INJECTION 61%
125.0000 mL | Freq: Once | INTRAVENOUS | Status: AC | PRN
  Administered 2017-12-09: 125 mL via INTRAVENOUS

## 2017-12-14 ENCOUNTER — Ambulatory Visit
Admission: RE | Admit: 2017-12-14 | Discharge: 2017-12-14 | Disposition: A | Discharge status: Home / Self Care | Payer: 59 | Source: Ambulatory Visit | Attending: Urology | Admitting: Urology

## 2017-12-14 ENCOUNTER — Ambulatory Visit (INDEPENDENT_AMBULATORY_CARE_PROVIDER_SITE_OTHER): Payer: 59 | Admitting: Urology

## 2017-12-14 ENCOUNTER — Encounter: Payer: Self-pay | Admitting: Urology

## 2017-12-14 ENCOUNTER — Other Ambulatory Visit: Payer: Self-pay

## 2017-12-14 VITALS — BP 143/94 | HR 88 | Wt 194.0 lb

## 2017-12-14 DIAGNOSIS — R3129 Other microscopic hematuria: Secondary | ICD-10-CM | POA: Diagnosis not present

## 2017-12-14 DIAGNOSIS — N200 Calculus of kidney: Secondary | ICD-10-CM | POA: Diagnosis not present

## 2017-12-14 DIAGNOSIS — N201 Calculus of ureter: Secondary | ICD-10-CM

## 2017-12-14 LAB — URINALYSIS, COMPLETE
Bilirubin, UA: NEGATIVE
Glucose, UA: NEGATIVE
KETONES UA: NEGATIVE
Leukocytes, UA: NEGATIVE
NITRITE UA: NEGATIVE
Protein, UA: NEGATIVE
Specific Gravity, UA: 1.015 (ref 1.005–1.030)
Urobilinogen, Ur: 1 mg/dL (ref 0.2–1.0)
pH, UA: 6.5 (ref 5.0–7.5)

## 2017-12-14 LAB — MICROSCOPIC EXAMINATION
Bacteria, UA: NONE SEEN
EPITHELIAL CELLS (NON RENAL): NONE SEEN /HPF (ref 0–10)
WBC, UA: NONE SEEN /hpf (ref 0–5)

## 2017-12-14 NOTE — Progress Notes (Signed)
12/14/2017 8:20 PM   Nicki Reaper [name] 11/06/1965 [id number]  Referring provider: Jerrol Banana., MD [address] [place], [place] [address]  Chief Complaint  Patient presents with  . Procedure    Cystoscopy    HPI: 52 year old male who saw Zara Council on 11/15/2017 with right flank pain and microhematuria.  CTU and cystoscopy was recommended.  His CT urogram that showed 2 right distal ureteral calculi with moderate hydronephrosis/hydroureter.  He saw his CT report and is refusing cystoscopy today.  Is flank pain has improved however he is presently having some mild groin discomfort associated with frequency and urgency.  He is not aware of passing a stone.  He denies prior history of stone disease.  PMH: Past Medical History:  Diagnosis Date  . Allergy   . Arterioloscleroses 2017  . Hyperlipidemia     Surgical History: Past Surgical History:  Procedure Laterality Date  . COLONOSCOPY WITH PROPOFOL N/A 09/26/2015   Procedure: COLONOSCOPY WITH PROPOFOL;  Surgeon: Lucilla Lame, MD;  Location: [facility];  Service: Endoscopy;  Laterality: N/A;  requests early  . HEMORRHOID SURGERY    . LASIK    . POLYPECTOMY  09/26/2015   Procedure: POLYPECTOMY;  Surgeon: Lucilla Lame, MD;  Location: [facility];  Service: Endoscopy;;    Home Medications:  Allergies as of 12/14/2017   No Known Allergies     Medication List        Accurate as of 12/14/17  8:20 PM. Always use your most recent med list.          aspirin EC 81 MG tablet Take by mouth.   atorvastatin 80 MG tablet Commonly known as:  LIPITOR Take 1 tablet (80 mg total) by mouth daily.   multivitamin capsule Take by mouth.   omega-3 acid ethyl esters 1 g capsule Commonly known as:  LOVAZA Take 1 capsule (1 g total) by mouth 2 (two) times daily.   PROBIOTIC DAILY PO Take by mouth.       Allergies: No Known Allergies  Family History: Family History  Problem Relation Age  of Onset  . Hyperlipidemia Mother   . Hypertension Mother   . Diabetes Mother   . Heart disease Mother   . Sarcoidosis Father   . Hyperlipidemia Brother   . Hyperlipidemia Brother   . Heart attack Brother     Social History:  reports that he has never smoked. He has never used smokeless tobacco. He reports that he does not use drugs. His alcohol history is not on file.  ROS: Unchanged from 11/15/2017 except as noted in the HPI  Physical Exam: BP (!) 143/94   Pulse 88   Wt 194 lb (88 kg)   BMI 28.65 kg/m   Constitutional:  Alert and oriented, No acute distress. HEENT: [place] AT, moist mucus membranes.  Trachea midline, no masses. Cardiovascular: No clubbing, cyanosis, or edema.  RRR Respiratory: Normal respiratory effort, no increased work of breathing.  Clear GI: Abdomen is soft, nontender, nondistended, no abdominal masses GU: No CVA tenderness Lymph: No cervical or inguinal lymphadenopathy. Skin: No rashes, bruises or suspicious lesions. Neurologic: Grossly intact, no focal deficits, moving all 4 extremities. Psychiatric: Normal mood and affect.  Laboratory Data:  Urinalysis: Dipstick 2+ blood/microscopy 3-10 RBC.  Pertinent Imaging: Images were personally reviewed  Results for orders placed during the hospital encounter of 12/09/17  CT HEMATURIA WORKUP   Narrative CLINICAL DATA:  Intermittent right flank pain radiating to the groin.  Hematuria.  EXAM: CT ABDOMEN AND PELVIS WITHOUT AND WITH CONTRAST  TECHNIQUE: Multidetector CT imaging of the abdomen and pelvis was performed following the standard protocol before and following the bolus administration of intravenous contrast.  CONTRAST:  194m ISOVUE-300 IOPAMIDOL (ISOVUE-300) INJECTION 61%  COMPARISON:  Chest CT 04/26/2014.  FINDINGS: Lower chest: 2.6 cm round smoothly marginated subpleural nodule is identified in the left lower lobe. This as prominent central calcification. When I remeasure the lesion at the  same level and in a similar fashion on the prior study was 2.4 cm consistent with no substantial interval change in the nearly 4 year interval.  Hepatobiliary: No focal abnormality within the liver parenchyma. There is no evidence for gallstones, gallbladder wall thickening, or pericholecystic fluid. No intrahepatic or extrahepatic biliary dilation.  Pancreas: No focal mass lesion. No dilatation of the main duct. No intraparenchymal cyst. No peripancreatic edema.  Spleen: No splenomegaly. No focal mass lesion.  Adrenals/Urinary Tract: No adrenal nodule or mass.  Precontrast imaging shows 2 mm nonobstructing stone in the lower pole the right kidney (coronal image 87 of series 5). 4 x 5 mm stone identified in the distal right ureter about 1 cm proximal to the UVJ. There is an adjacent 2 mm stone just proximal to the 5 mm distal right ureteral stone. 6 mm nonobstructing stone identified in the interpolar left kidney. No ureteral or bladder stones.  Mild fullness noted of the right intrarenal collecting system. Small hypoattenuating lesions in each kidney are too small to characterize. 1.8 cm exophytic lesion posterior left kidney contains fat attenuation and is probably angiomyolipoma.  Delayed imaging shows no wall thickening or soft tissue filling defect in either intrarenal collecting system or renal pelvis. Both ureters are well opacified and show no focal dilatation or soft tissue mass lesion. No focal bladder wall abnormality evident.  Stomach/Bowel: Stomach is nondistended. No gastric wall thickening. No evidence of outlet obstruction. Duodenum is normally positioned as is the ligament of Treitz. No small bowel wall thickening. No small bowel dilatation. The terminal ileum is normal. The appendix is normal. No gross colonic mass. No colonic wall thickening.  Vascular/Lymphatic: There is abdominal aortic atherosclerosis without aneurysm. There is no gastrohepatic or  hepatoduodenal ligament lymphadenopathy. No intraperitoneal or retroperitoneal lymphadenopathy. No pelvic sidewall lymphadenopathy.  Reproductive: Prostate gland unremarkable.  Other: No intraperitoneal free fluid.  Musculoskeletal: Small sclerotic focus in the anterior right acetabulum is likely a bone island. No other suspicious or worrisome lytic/sclerotic osseous abnormality evident. Small paraumbilical hernia contains only fat.  IMPRESSION: 1. Bilateral nephrolithiasis with mild right hydroureteronephrosis due to the presence of adjacent distal ureteral stones measuring 5 mm and 2 mm. 2. Bilateral tiny renal lesions too small to characterize with 1.8 cm posterior left exophytic lesion demonstrating fat density and likely representing angiomyolipoma. 3. No urothelial abnormality identified in either kidney, ureter, or in the bladder. 4. 2.6 cm left lower lobe pulmonary nodule, not substantially changed since 04/26/2014 and most likely benign. Hamartoma would be a consideration.   Electronically Signed   By: EMisty StanleyM.D.   On: 12/09/2017 12:56      Assessment & Plan:   52year old male with 2 and 5 mm right distal ureteral calculi and bilateral, nonobstructing renal calculi most likely accounting for his microhematuria and flank pain.  Management options were discussed including a trial of passage, ureteroscopic removal and shockwave lithotripsy.  We will obtain a KUB to see if the stone is visualized on plain x-ray.  He  would like to think over these options.  He will be notified with the KUB results.  Rx tamsulosin was sent to his pharmacy.   Abbie Sons, [place] [address] [place], [place] [address] [phone]

## 2017-12-22 ENCOUNTER — Encounter: Payer: Self-pay | Admitting: Urology

## 2017-12-26 ENCOUNTER — Telehealth: Payer: Self-pay | Admitting: Urology

## 2017-12-26 ENCOUNTER — Other Ambulatory Visit: Payer: Self-pay

## 2017-12-26 DIAGNOSIS — N201 Calculus of ureter: Secondary | ICD-10-CM

## 2017-12-26 NOTE — Telephone Encounter (Signed)
Per our conversation I have called [facility] to call in Tamsulosin 0.29m once a day 14 no refills Can you just add that medication to his list please?   He also wanted to proceed with shockwave if we could get this set up for him? I told him I would pass on the message and someone would get in touch with him to discuss.    Thanks, MSharyn Lull

## 2017-12-27 MED ORDER — TAMSULOSIN HCL 0.4 MG PO CAPS: 0.4000 mg | ORAL_CAPSULE | Freq: Every day | ORAL | 0 refills | Status: DC

## 2017-12-27 NOTE — Telephone Encounter (Signed)
Scheduling sheet completed and I gave to Amy

## 2018-01-02 NOTE — Telephone Encounter (Signed)
-----   Message from Hollice Espy, MD sent at 12/28/2017 10:32 AM EST ----- Regarding: RE: ucx prior to ESWL next week im ok with this.  UA is fine.   ----- Message ----- From: Ranell Patrick, RN Sent: 12/28/2017   9:16 AM EST To: Hollice Espy, MD Subject: ucx prior to ESWL next week                    This is a patient that saw Dr Bernardo Heater that is on your schedule for ESWL next week. He has a basically negative ua but hasn't had a culture. Do I need to have him come in for one?

## 2018-01-03 ENCOUNTER — Ambulatory Visit (INDEPENDENT_AMBULATORY_CARE_PROVIDER_SITE_OTHER): Payer: 59 | Admitting: Urology

## 2018-01-03 ENCOUNTER — Encounter: Payer: Self-pay | Admitting: Urology

## 2018-01-03 VITALS — BP 143/74 | HR 71 | Ht 69.0 in | Wt 195.5 lb

## 2018-01-03 DIAGNOSIS — N200 Calculus of kidney: Secondary | ICD-10-CM | POA: Diagnosis not present

## 2018-01-03 NOTE — Progress Notes (Signed)
01/03/2018 3:07 PM   Richard Adkins Richard Adkins 03-10-1965 [id number]  Referring provider: Jerrol Banana., MD [address] [place], [place] [address]  Chief Complaint  Patient presents with  . Follow-up    HPI: 52 year-old male presents for follow-up of a right ureteral calculus  -Initially referred for microhematuria -CTU showed 5 mm right distal ureteral calculus near the UVJ and a 2 mm calculus just proximal to this, nonobstructing 6 mm left upper pole calculus, 2 mm right renal calculus, 18 mm probable left angiomyolipoma -Declined cystoscopy based on CT findings -Became symptomatic and was scheduled for ESWL this week however past 2 stones yesterday which he brings in today; resolution of symptoms -No prior history of stone disease  PMH: Past Medical History:  Diagnosis Date  . Allergy   . Arterioloscleroses 2017  . Hyperlipidemia     Surgical History: Past Surgical History:  Procedure Laterality Date  . COLONOSCOPY WITH PROPOFOL N/A 09/26/2015   Procedure: COLONOSCOPY WITH PROPOFOL;  Surgeon: Lucilla Lame, MD;  Location: [facility];  Service: Endoscopy;  Laterality: N/A;  requests early  . HEMORRHOID SURGERY    . LASIK    . POLYPECTOMY  09/26/2015   Procedure: POLYPECTOMY;  Surgeon: Lucilla Lame, MD;  Location: [facility];  Service: Endoscopy;;    Home Medications:  Allergies as of 01/03/2018   No Known Allergies     Medication List        Accurate as of 01/03/18  3:07 PM. Always use your most recent med list.          aspirin EC 81 MG tablet Take by mouth.   atorvastatin 80 MG tablet Commonly known as:  LIPITOR Take 1 tablet (80 mg total) by mouth daily.   multivitamin capsule Take by mouth.   omega-3 acid ethyl esters 1 g capsule Commonly known as:  LOVAZA Take 1 capsule (1 g total) by mouth 2 (two) times daily.   PROBIOTIC DAILY PO Take by mouth.   tamsulosin 0.4 MG Caps capsule Commonly known as:  FLOMAX Take 1  capsule (0.4 mg total) by mouth daily.       Allergies: No Known Allergies  Family History: Family History  Problem Relation Age of Onset  . Hyperlipidemia Mother   . Hypertension Mother   . Diabetes Mother   . Heart disease Mother   . Sarcoidosis Father   . Hyperlipidemia Brother   . Hyperlipidemia Brother   . Heart attack Brother     Social History:  reports that he has never smoked. He has never used smokeless tobacco. He reports that he drinks about 3.0 standard drinks of alcohol per week. He reports that he does not use drugs.  ROS: UROLOGY Frequent Urination?: No Hard to postpone urination?: No Burning/pain with urination?: No Get up at night to urinate?: No Leakage of urine?: No Urine stream starts and stops?: No Trouble starting stream?: No Do you have to strain to urinate?: No Blood in urine?: No Urinary tract infection?: No Sexually transmitted disease?: No Injury to kidneys or bladder?: No Painful intercourse?: No Weak stream?: No Erection problems?: No Penile pain?: No  Gastrointestinal Nausea?: No Vomiting?: No Indigestion/heartburn?: No Diarrhea?: No Constipation?: No  Constitutional Fever: No Night sweats?: No Weight loss?: No Fatigue?: No  Skin Skin rash/lesions?: No Itching?: No  Eyes Blurred vision?: No Double vision?: No  Ears/Nose/Throat Sore throat?: No Sinus problems?: No  Hematologic/Lymphatic Swollen glands?: No Easy bruising?: No  Cardiovascular Leg  swelling?: No Chest pain?: No  Respiratory Cough?: No Shortness of breath?: No  Endocrine Excessive thirst?: No  Musculoskeletal Back pain?: No Joint pain?: No  Neurological Headaches?: No Dizziness?: No  Psychologic Depression?: No Anxiety?: No  Physical Exam: BP (!) 143/74 (BP Location: Left Arm, Patient Position: Sitting, Cuff Size: Normal)   Pulse 71   Ht 5' 9"  (1.753 m)   Wt 195 lb 8 oz (88.7 kg)   BMI 28.87 kg/m   Constitutional:  Alert and  oriented, No acute distress. HEENT: [place] AT, moist mucus membranes.  Trachea midline, no masses. Cardiovascular: No clubbing, cyanosis, or edema. Respiratory: Normal respiratory effort, no increased work of breathing. Skin: No rashes, bruises or suspicious lesions. Neurologic: Grossly intact, no focal deficits, moving all 4 extremities. Psychiatric: Normal mood and affect.    Assessment & Plan:   52 year old male with a passed ureteral calculi.  -Calculi were examined and are of appropriate size of calculi seen on CT  -Recommend metabolic evaluation including 24-hour urine study and blood work.  -Follow-up 6 months with KUB; options of left nephrolithiasis discussed including observation and shockwave lithotripsy  -Follow-up imaging angiomyolipoma  Abbie Sons, MD  [facility] [address] [place],  [address] [phone]

## 2018-01-06 ENCOUNTER — Other Ambulatory Visit: Payer: Self-pay | Admitting: Urology

## 2018-01-08 NOTE — Progress Notes (Signed)
Stone analysis was mixed calcium oxalate.  Proceed with 24-hour urine study as previously discussed

## 2018-01-09 ENCOUNTER — Telehealth: Payer: Self-pay

## 2018-01-09 NOTE — Telephone Encounter (Signed)
Stone analysis was mixed calcium oxalate.  Proceed with 24-hour urine study as previously discussed

## 2018-01-23 ENCOUNTER — Other Ambulatory Visit: Payer: 59

## 2018-02-23 ENCOUNTER — Other Ambulatory Visit: Payer: Self-pay | Admitting: Urology

## 2018-02-24 ENCOUNTER — Ambulatory Visit (INDEPENDENT_AMBULATORY_CARE_PROVIDER_SITE_OTHER): Payer: 59 | Admitting: Urology

## 2018-02-24 ENCOUNTER — Encounter: Payer: Self-pay | Admitting: Urology

## 2018-02-24 VITALS — BP 146/88 | HR 72 | Ht 69.0 in | Wt 195.0 lb

## 2018-02-24 DIAGNOSIS — N200 Calculus of kidney: Secondary | ICD-10-CM

## 2018-02-24 NOTE — Progress Notes (Signed)
LMOM for patient to return call.  24 urine study reviewed and only mild abnormalities were noted.  24-hour urine volume had mild elevation of calcium.  Urine sodium was elevated and reducing sodium intake should improve the calcium level.  His urine output was 1.95 L and would recommend increasing water intake keep urine output around 2.5 L/day.

## 2018-02-24 NOTE — Progress Notes (Signed)
02/24/2018  9:24 AM   Richard Adkins 03-Jan-1966 [id number]  Referring provider: Jerrol Banana., MD [address] [place], [place] [address]  Chief Complaint  Patient presents with  . Nephrolithiasis   HPI: Richard Adkins is a 53 y.o. White or Caucasian male that presents today to discuss 24-hour urine analysis results.  - 24-hour urine results: low urine output (1.95L), mild hypercalciuria, elevated urine sodium  - Denies dysuria, gross hematuria or flank/abdominal/pelvic/scrotal pain.  PMH: Past Medical History:  Diagnosis Date  . Allergy   . Arterioloscleroses 2017  . Hyperlipidemia    Surgical History: Past Surgical History:  Procedure Laterality Date  . COLONOSCOPY WITH PROPOFOL N/A 09/26/2015   Procedure: COLONOSCOPY WITH PROPOFOL;  Surgeon: Lucilla Lame, MD;  Location: [facility];  Service: Endoscopy;  Laterality: N/A;  requests early  . HEMORRHOID SURGERY    . LASIK    . POLYPECTOMY  09/26/2015   Procedure: POLYPECTOMY;  Surgeon: Lucilla Lame, MD;  Location: [facility];  Service: Endoscopy;;    Home Medications:  Allergies as of 02/24/2018   No Known Allergies     Medication List       Accurate as of February 24, 2018  9:24 AM. Always use your most recent med list.        aspirin EC 81 MG tablet Take by mouth.   atorvastatin 80 MG tablet Commonly known as:  LIPITOR Take 1 tablet (80 mg total) by mouth daily.   fluticasone 50 MCG/ACT nasal spray Commonly known as:  FLONASE   multivitamin capsule Take by mouth.   omega-3 acid ethyl esters 1 g capsule Commonly known as:  LOVAZA Take 1 capsule (1 g total) by mouth 2 (two) times daily.   PROBIOTIC DAILY PO Take by mouth.       Allergies: No Known Allergies  Family History: Family History  Problem Relation Age of Onset  . Hyperlipidemia Mother   . Hypertension Mother   . Diabetes Mother   . Heart disease Mother   . Sarcoidosis Father   . Hyperlipidemia Brother    . Hyperlipidemia Brother   . Heart attack Brother     Social History:  reports that he has never smoked. He has never used smokeless tobacco. He reports current alcohol use of about 3.0 standard drinks of alcohol per week. He reports that he does not use drugs.  ROS: UROLOGY Frequent Urination?: No Hard to postpone urination?: No Burning/pain with urination?: No Get up at night to urinate?: No Leakage of urine?: No Urine stream starts and stops?: No Trouble starting stream?: No Do you have to strain to urinate?: No Blood in urine?: No Urinary tract infection?: No Sexually transmitted disease?: No Injury to kidneys or bladder?: No Painful intercourse?: No Weak stream?: No Erection problems?: No Penile pain?: No  Gastrointestinal Nausea?: No Vomiting?: No Indigestion/heartburn?: No Diarrhea?: No Constipation?: No  Constitutional Fever: No Night sweats?: No Weight loss?: No Fatigue?: No  Skin Skin rash/lesions?: No Itching?: No  Eyes Blurred vision?: No Double vision?: No  Ears/Nose/Throat Sore throat?: No Sinus problems?: No  Hematologic/Lymphatic Swollen glands?: No Easy bruising?: No  Cardiovascular Leg swelling?: No Chest pain?: No  Respiratory Cough?: No Shortness of breath?: No  Endocrine Excessive thirst?: No  Musculoskeletal Back pain?: No Joint pain?: No  Neurological Headaches?: No Dizziness?: No  Psychologic Depression?: No Anxiety?: No  Physical Exam: BP (!) 146/88 (BP Location: Left Arm, Patient Position: Sitting, Cuff Size: Normal)  Pulse 72   Ht 5' 9"  (1.753 m)   Wt 195 lb (88.5 kg)   BMI 28.80 kg/m   Constitutional:  Alert and oriented, No acute distress. Respiratory: Normal respiratory effort, no increased work of breathing. Head: Normocephalic and Atraumatic. GU: No CVA tenderness Skin: No rashes, bruises or suspicious lesions. Neurologic: Grossly intact, no focal deficits, moving all 4  extremities. Psychiatric: Normal mood and affect.  Laboratory Results  Lab Results  Component Value Date   PSA 0.7 10/17/2013   Lab Results  Component Value Date   CREATININE 0.91 10/31/2017   Assessment & Plan:   1. Nephrolithiasis  - Calculi passed and examined  - 24-hour urine results: low urine output (1.95L), mild hypercalciuria, elevated urine sodium  - Discussed increasing water intake to allow for 2.5L urine output as well as reducing sodium intake  - Return in 6 months with KUB prior  Return in about 5 months (around 07/12/2018) for with KUB prior.  Abbie Sons, MD [facility] [address] [place], [place] [address] [phone]  I, [phone] Renne Crigler , am acting as a scribe for Abbie Sons, MD  I, Abbie Sons, MD, have reviewed all documentation for this visit. The documentation on 02/24/18 for the exam, diagnosis, procedures, and orders are all accurate and complete.

## 2018-03-03 ENCOUNTER — Encounter: Payer: Self-pay | Admitting: Family Medicine

## 2018-03-03 ENCOUNTER — Ambulatory Visit (INDEPENDENT_AMBULATORY_CARE_PROVIDER_SITE_OTHER): Payer: 59 | Admitting: Family Medicine

## 2018-03-03 DIAGNOSIS — Z23 Encounter for immunization: Secondary | ICD-10-CM | POA: Diagnosis not present

## 2018-03-03 NOTE — Progress Notes (Signed)
Patient here for vaccination only.  I did not evaluate the patient.  I did review vaccine consent and medical history.  Virginia Crews, MD, MPH [facility] 03/03/2018 2:42 PM

## 2018-03-20 ENCOUNTER — Other Ambulatory Visit: Payer: Self-pay | Admitting: Family Medicine

## 2018-03-20 DIAGNOSIS — E785 Hyperlipidemia, unspecified: Secondary | ICD-10-CM

## 2018-03-21 ENCOUNTER — Telehealth: Payer: Self-pay | Admitting: Family Medicine

## 2018-03-21 NOTE — Telephone Encounter (Signed)
Rx was sent to pharmacy today.

## 2018-03-21 NOTE — Telephone Encounter (Signed)
Pt needs a refill on  Omega 3 x 90 day  [facility]  CB# [phone]

## 2018-03-27 ENCOUNTER — Telehealth: Payer: Self-pay | Admitting: Family Medicine

## 2018-03-27 DIAGNOSIS — Z0000 Encounter for general adult medical examination without abnormal findings: Secondary | ICD-10-CM

## 2018-03-27 DIAGNOSIS — Z125 Encounter for screening for malignant neoplasm of prostate: Secondary | ICD-10-CM

## 2018-03-27 NOTE — Telephone Encounter (Signed)
Pt requesting his labs be drawn prior to his CPE on 03/30/18.

## 2018-03-27 NOTE — Telephone Encounter (Signed)
Please advise 

## 2018-03-28 NOTE — Telephone Encounter (Signed)
ok 

## 2018-03-28 NOTE — Telephone Encounter (Signed)
Labs ordered.

## 2018-03-29 LAB — COMPREHENSIVE METABOLIC PANEL
ALBUMIN: 4.8 g/dL (ref 3.8–4.9)
ALK PHOS: 93 IU/L (ref 39–117)
ALT: 40 IU/L (ref 0–44)
AST: 32 IU/L (ref 0–40)
Albumin/Globulin Ratio: 2.4 — ABNORMAL HIGH (ref 1.2–2.2)
BILIRUBIN TOTAL: 1.7 mg/dL — AB (ref 0.0–1.2)
BUN/Creatinine Ratio: 15 (ref 9–20)
BUN: 16 mg/dL (ref 6–24)
CHLORIDE: 105 mmol/L (ref 96–106)
CO2: 21 mmol/L (ref 20–29)
Calcium: 9.2 mg/dL (ref 8.7–10.2)
Creatinine, Ser: 1.04 mg/dL (ref 0.76–1.27)
GFR calc non Af Amer: 82 mL/min/{1.73_m2} (ref >59)
GFR, EST AFRICAN AMERICAN: 94 mL/min/{1.73_m2} (ref >59)
GLOBULIN, TOTAL: 2 g/dL (ref 1.5–4.5)
GLUCOSE: 102 mg/dL — AB (ref 65–99)
POTASSIUM: 4.4 mmol/L (ref 3.5–5.2)
Sodium: 142 mmol/L (ref 134–144)
TOTAL PROTEIN: 6.8 g/dL (ref 6.0–8.5)

## 2018-03-29 LAB — CBC WITH DIFFERENTIAL/PLATELET
BASOS: 1 %
Basophils Absolute: 0 10*3/uL (ref 0.0–0.2)
EOS (ABSOLUTE): 0.1 10*3/uL (ref 0.0–0.4)
EOS: 1 %
HEMATOCRIT: 46.2 % (ref 37.5–51.0)
HEMOGLOBIN: 15.9 g/dL (ref 13.0–17.7)
IMMATURE GRANS (ABS): 0 10*3/uL (ref 0.0–0.1)
Immature Granulocytes: 0 %
LYMPHS ABS: 1.8 10*3/uL (ref 0.7–3.1)
LYMPHS: 24 %
MCH: 30.2 pg (ref 26.6–33.0)
MCHC: 34.4 g/dL (ref 31.5–35.7)
MCV: 88 fL (ref 79–97)
MONOCYTES: 8 %
Monocytes Absolute: 0.6 10*3/uL (ref 0.1–0.9)
NEUTROS ABS: 4.9 10*3/uL (ref 1.4–7.0)
Neutrophils: 66 %
Platelets: 229 10*3/uL (ref 150–450)
RBC: 5.27 x10E6/uL (ref 4.14–5.80)
RDW: 13 % (ref 11.6–15.4)
WBC: 7.4 10*3/uL (ref 3.4–10.8)

## 2018-03-29 LAB — LIPID PANEL
CHOLESTEROL TOTAL: 117 mg/dL (ref 100–199)
Chol/HDL Ratio: 4.5 ratio (ref 0.0–5.0)
HDL: 26 mg/dL — AB (ref >39)
LDL Calculated: 59 mg/dL (ref 0–99)
TRIGLYCERIDES: 162 mg/dL — AB (ref 0–149)
VLDL CHOLESTEROL CAL: 32 mg/dL (ref 5–40)

## 2018-03-29 LAB — PSA: Prostate Specific Ag, Serum: 0.6 ng/mL (ref 0.0–4.0)

## 2018-03-29 LAB — TSH: TSH: 1.28 u[IU]/mL (ref 0.450–4.500)

## 2018-03-30 ENCOUNTER — Encounter: Payer: Self-pay | Admitting: Family Medicine

## 2018-03-30 ENCOUNTER — Other Ambulatory Visit: Payer: Self-pay | Admitting: Family Medicine

## 2018-03-30 ENCOUNTER — Ambulatory Visit (INDEPENDENT_AMBULATORY_CARE_PROVIDER_SITE_OTHER): Payer: 59 | Admitting: Family Medicine

## 2018-03-30 ENCOUNTER — Other Ambulatory Visit: Payer: Self-pay

## 2018-03-30 VITALS — BP 124/81 | HR 74 | Ht 69.0 in | Wt 197.4 lb

## 2018-03-30 DIAGNOSIS — E785 Hyperlipidemia, unspecified: Secondary | ICD-10-CM | POA: Diagnosis not present

## 2018-03-30 DIAGNOSIS — K429 Umbilical hernia without obstruction or gangrene: Secondary | ICD-10-CM | POA: Diagnosis not present

## 2018-03-30 DIAGNOSIS — Z0000 Encounter for general adult medical examination without abnormal findings: Secondary | ICD-10-CM | POA: Diagnosis not present

## 2018-03-30 MED ORDER — ICOSAPENT ETHYL 1 G PO CAPS: 1.0000 | ORAL_CAPSULE | Freq: Two times a day (BID) | ORAL | 3 refills | Status: DC

## 2018-03-30 NOTE — Progress Notes (Signed)
Patient: Richard Adkins, Male    DOB: 1965/04/26, 53 y.o.   MRN: [id number] Visit Date: 03/30/2018  Today's Provider: [name], MD   Chief Complaint  Patient presents with  . Annual Exam   Subjective:     Annual physical exam Richard Adkins is a 53 y.o. male who presents today for health maintenance and complete physical. He feels well. He reports exercising not much at all. He reports he is sleeping fairly well.  -----------------------------------------------------------------   Review of Systems  Constitutional: Negative.   HENT: Negative.   Eyes: Negative.   Respiratory: Negative.   Cardiovascular: Negative.   Gastrointestinal: Negative.   Endocrine: Negative.   Genitourinary: Negative.   Musculoskeletal: Negative.   Skin: Negative.   Allergic/Immunologic: Negative.   Neurological: Negative.   Hematological: Negative.   Psychiatric/Behavioral: Negative.     Social History      He  reports that he has never smoked. He has never used smokeless tobacco. He reports current alcohol use of about 3.0 standard drinks of alcohol per week. He reports that he does not use drugs.       Social History   Socioeconomic History  . Marital status: Married    Spouse name: Not on file  . Number of children: 2  . Years of education: Not on file  . Highest education level: Not on file  Occupational History  . Not on file  Social Needs  . Financial resource strain: Not on file  . Food insecurity:    Worry: Not on file    Inability: Not on file  . Transportation needs:    Medical: Not on file    Non-medical: Not on file  Tobacco Use  . Smoking status: Never Smoker  . Smokeless tobacco: Never Used  Substance and Sexual Activity  . Alcohol use: Yes    Alcohol/week: 3.0 standard drinks    Types: 3 Cans of beer per week    Comment: drinks beer on weekends  . Drug use: No  . Sexual activity: Yes    Birth control/protection: None  Lifestyle  . Physical  activity:    Days per week: Not on file    Minutes per session: Not on file  . Stress: Not on file  Relationships  . Social connections:    Talks on phone: Not on file    Gets together: Not on file    Attends religious service: Not on file    Active member of club or organization: Not on file    Attends meetings of clubs or organizations: Not on file    Relationship status: Not on file  Other Topics Concern  . Not on file  Social History Narrative  . Not on file    Past Medical History:  Diagnosis Date  . Allergy   . Arterioloscleroses 2017  . Hyperlipidemia      Patient Active Problem List   Diagnosis Date Noted  . Nephrolithiasis 02/24/2018  . Arteriosclerosis of coronary artery 11/25/2014  . Mixed hyperlipidemia 11/25/2014  . CAD (coronary artery disease) 11/25/2014  . Allergic rhinitis 10/23/2014  . Borderline diabetes 10/23/2014  . Overweight 10/23/2014  . Dyslipidemia 06/18/2011  . Combined fat and carbohydrate induced hyperlipemia 03/27/2007  . Family history of cardiovascular disease 11/24/2006    Past Surgical History:  Procedure Laterality Date  . COLONOSCOPY WITH PROPOFOL N/A 09/26/2015   Procedure: COLONOSCOPY WITH PROPOFOL;  Surgeon: Lucilla Lame, MD;  Location: [facility];  Service: Endoscopy;  Laterality: N/A;  requests early  . HEMORRHOID SURGERY    . LASIK    . POLYPECTOMY  09/26/2015   Procedure: POLYPECTOMY;  Surgeon: Lucilla Lame, MD;  Location: [facility];  Service: Endoscopy;;    Family History        Family Status  Relation Name Status  . Mother  Alive  . Father  Deceased at age 56  . Brother  Alive  . Brother  Alive  . Brother  Deceased at age 23        His family history includes Diabetes in his mother; Heart attack in his brother; Heart disease in his mother; Hyperlipidemia in his brother, brother, and mother; Hypertension in his mother; Sarcoidosis in his father.      No Known Allergies   Current Outpatient  Medications:  .  aspirin EC 81 MG tablet, Take by mouth., Disp: , Rfl:  .  atorvastatin (LIPITOR) 80 MG tablet, Take 1 tablet (80 mg total) by mouth daily., Disp: 90 tablet, Rfl: 3 .  fluticasone (FLONASE) 50 MCG/ACT nasal spray, , Disp: , Rfl:  .  Multiple Vitamin (MULTIVITAMIN) capsule, Take by mouth., Disp: , Rfl:  .  omega-3 acid ethyl esters (LOVAZA) 1 g capsule, TAKE 1 CAPSULE (1 G TOTAL) BY MOUTH 2 (TWO) TIMES DAILY., Disp: 180 capsule, Rfl: 3 .  Probiotic Product (PROBIOTIC DAILY PO), Take by mouth., Disp: , Rfl:    Patient Care Team: Jerrol Banana., MD as PCP - General (Family Medicine)    Objective:    Vitals: BP 124/81 (BP Location: Right Arm, Patient Position: Sitting, Cuff Size: Normal)   Pulse 74   Ht 5' 9"  (1.753 m)   Wt 197 lb 6.4 oz (89.5 kg)   SpO2 96%   BMI 29.15 kg/m    Vitals:   03/30/18 0918  BP: 124/81  Pulse: 74  SpO2: 96%  Weight: 197 lb 6.4 oz (89.5 kg)  Height: 5' 9"  (1.753 m)     Physical Exam Constitutional:      Appearance: Normal appearance. He is well-developed.  HENT:     Head: Normocephalic and atraumatic.     Right Ear: Tympanic membrane and external ear normal.     Left Ear: Tympanic membrane and external ear normal.     Nose: Nose normal.     Mouth/Throat:     Pharynx: Oropharynx is clear.  Eyes:     General: No scleral icterus.    Conjunctiva/sclera: Conjunctivae normal.  Neck:     Thyroid: No thyromegaly.  Cardiovascular:     Rate and Rhythm: Normal rate and regular rhythm.     Heart sounds: Normal heart sounds.  Pulmonary:     Effort: Pulmonary effort is normal.  Abdominal:     Palpations: Abdomen is soft.     Comments: Patient with a small, nontender umbilical hernia.  Genitourinary:    Penis: Normal.      Scrotum/Testes: Normal.     Comments: Right testicle normal.  Left testicle lower inguinal canal. Lymphadenopathy:     Cervical: No cervical adenopathy.  Skin:    General: Skin is warm and dry.    Neurological:     Mental Status: He is alert and oriented to person, place, and time.  Psychiatric:        Mood and Affect: Mood normal.        Behavior: Behavior normal.        Thought Content: Thought content normal.  Judgment: Judgment normal.      Depression Screen PHQ 2/9 Scores 03/30/2018 03/30/2018 03/08/2017 03/08/2017  PHQ - 2 Score 0 0 0 0  PHQ- 9 Score 2 - 2 -       Assessment & Plan:     Routine Health Maintenance and Physical Exam  Exercise Activities and Dietary recommendations Goals   None     Immunization History  Administered Date(s) Administered  . Influenza,inj,Quad PF,6+ Mos 12/11/2012, 10/17/2013, 10/24/2014, 03/03/2016, 03/03/2018  . Influenza-Unspecified 11/01/2016  . Td 08/18/2017  . Tdap 07/19/2007    Health Maintenance  Topic Date Due  . HIV Screening  06/15/1979  . COLONOSCOPY  09/25/2025  . TETANUS/TDAP  08/19/2027  . INFLUENZA VACCINE  Completed     Discussed health benefits of physical activity, and encouraged him to engage in regular exercise appropriate for his age and condition.  1. Encounter for annual physical exam Lifestyle with regular exercise encouraged  2. Hyperlipidemia, unspecified hyperlipidemia type With treatment with omega-3 fish oil triglycerides have improved a good bit.  Continue this.with Vascepa - Icosapent Ethyl (VASCEPA) 1 g CAPS; Take 1 capsule (1 g total) by mouth 2 (two) times daily.  Dispense: 180 capsule; Refill: 3  3. Umbilical hernia without obstruction and without gangrene Follow clinically.  Presently very small.    -------------------------------------------------------------------- I have done the exam and reviewed the chart and it is accurate to the best of my knowledge. Development worker, community has been used and  any errors in dictation or transcription are unintentional. Miguel Aschoff M.D. [place], MD  [facility]

## 2018-04-03 ENCOUNTER — Other Ambulatory Visit: Payer: Self-pay | Admitting: Family Medicine

## 2018-04-03 MED ORDER — FLUTICASONE PROPIONATE 50 MCG/ACT NA SUSP: 1.0000 | Freq: Every day | NASAL | 5 refills | Status: DC

## 2018-04-03 NOTE — Telephone Encounter (Signed)
Patient needs refills on Fluticasone Spray (gets 90 days worth with 3 RF) [facility] [address]

## 2018-05-19 ENCOUNTER — Other Ambulatory Visit: Payer: Self-pay | Admitting: Family Medicine

## 2018-05-19 DIAGNOSIS — E782 Mixed hyperlipidemia: Secondary | ICD-10-CM

## 2018-07-12 ENCOUNTER — Ambulatory Visit
Admission: RE | Admit: 2018-07-12 | Discharge: 2018-07-12 | Disposition: A | Discharge status: Home / Self Care | Payer: 59 | Source: Ambulatory Visit | Attending: Urology | Admitting: Urology

## 2018-07-12 ENCOUNTER — Other Ambulatory Visit: Payer: Self-pay

## 2018-07-12 ENCOUNTER — Ambulatory Visit: Payer: 59 | Admitting: Urology

## 2018-07-12 DIAGNOSIS — N200 Calculus of kidney: Secondary | ICD-10-CM | POA: Insufficient documentation

## 2018-07-14 ENCOUNTER — Ambulatory Visit: Payer: 59 | Admitting: Urology

## 2018-07-24 ENCOUNTER — Telehealth (INDEPENDENT_AMBULATORY_CARE_PROVIDER_SITE_OTHER): Payer: 59 | Admitting: Urology

## 2018-07-24 ENCOUNTER — Other Ambulatory Visit: Payer: Self-pay

## 2018-07-24 DIAGNOSIS — N200 Calculus of kidney: Secondary | ICD-10-CM

## 2018-07-24 DIAGNOSIS — D179 Benign lipomatous neoplasm, unspecified: Secondary | ICD-10-CM

## 2018-07-25 DIAGNOSIS — D179 Benign lipomatous neoplasm, unspecified: Secondary | ICD-10-CM | POA: Insufficient documentation

## 2018-07-25 NOTE — Progress Notes (Signed)
Virtual Visit via Video Note  I connected with Richard Adkins on 07/25/18 at  3:00 PM EDT by a video enabled telemedicine application and verified that I am speaking with the correct person using two identifiers.  Location: Patient: Home Provider: Office   I discussed the limitations of evaluation and management by telemedicine and the availability of in person appointments. The patient expressed understanding and agreed to proceed.  History of Present Illness: 53 year old male initially seen in October 2019 with a 5 mm right UVJ calculus and 2 mm calculus just proximal.  He also had a nonobstructing 6 mm left upper pole calculus and a 2 mm right renal calculus.  He was incidentally noted to have a probable 18 mm left angiomyolipoma.  Due to pain he had scheduled shockwave lithotripsy however passed both of these calculi prior to the procedure and has been asymptomatic since that time.  Stone analysis was mixed calcium oxalate.  Metabolic evaluation remarkable for mild hypercalciuria with an elevated sodium.  Urine output was 1.95 L.  Overall he has been doing well.  Last week he did have an episode of some frequency and urgency.  KUB performed earlier this month shows a stable left renal calculus.  There are for this in the right true bony pelvis.   Observations/Objective: Alert, no acute distress  Assessment and Plan: 52 year old male with a nonobstructing left renal calculus which is asymptomatic.  Incidental small left angiomyolipoma.  Options were discussed regarding his left nephrolithiasis including surveillance and shockwave lithotripsy.  He has elected surveillance for now.  Will schedule a renal ultrasound and follow-up of the left angiomyolipoma.  Follow Up Instructions: Renal ultrasound as above.  If stable follow-up KUB 6 months.   I discussed the assessment and treatment plan with the patient. The patient was provided an opportunity to ask questions and all were answered.  The patient agreed with the plan and demonstrated an understanding of the instructions.   The patient was advised to call back or seek an in-person evaluation if the symptoms worsen or if the condition fails to improve as anticipated.  I provided 10 minutes of non-face-to-face time during this encounter.   Abbie Sons, MD

## 2018-10-24 ENCOUNTER — Other Ambulatory Visit: Payer: Self-pay | Admitting: *Deleted

## 2018-10-24 DIAGNOSIS — Z20822 Contact with and (suspected) exposure to covid-19: Secondary | ICD-10-CM

## 2018-10-25 LAB — NOVEL CORONAVIRUS, NAA: SARS-CoV-2, NAA: NOT DETECTED

## 2018-12-18 ENCOUNTER — Other Ambulatory Visit: Payer: Self-pay

## 2018-12-18 DIAGNOSIS — Z20822 Contact with and (suspected) exposure to covid-19: Secondary | ICD-10-CM

## 2018-12-20 LAB — NOVEL CORONAVIRUS, NAA: SARS-CoV-2, NAA: NOT DETECTED

## 2019-03-30 ENCOUNTER — Telehealth: Payer: Self-pay

## 2019-03-30 NOTE — Telephone Encounter (Signed)
Copied from [place] [id number]. Topic: Appointment Scheduling - Scheduling Inquiry for Clinic >> Mar 30, 2019 10:36 AM Lennox Solders wrote: Reason for CRM: pt is calling and would like to have physical labs in advance on 04-02-2019. Pt is sch for physical on 04-04-2019

## 2019-04-02 NOTE — Telephone Encounter (Signed)
Patient advised that Dr. Rosanna Randy decides what labs are to be done during the physical.

## 2019-04-03 ENCOUNTER — Other Ambulatory Visit: Payer: Self-pay | Admitting: Family Medicine

## 2019-04-03 DIAGNOSIS — E785 Hyperlipidemia, unspecified: Secondary | ICD-10-CM

## 2019-04-03 NOTE — Progress Notes (Signed)
Patient: Richard Adkins, Male    DOB: 1965/04/23, 54 y.o.   MRN: [id number] Visit Date: 04/04/2019  Today's Provider: [name], MD   Chief Complaint  Patient presents with  . Annual Exam   Subjective:     Annual physical exam Richard Adkins is a 54 y.o. male who presents today for health maintenance and complete physical. He feels well. He reports no regular exercising due to winter weather. He reports he is sleeping fairly well. He is a married Teaching laboratory technician.  He has a daughter that is 54 at [facility] and his son that is 69 at [facility]. -----------------------------------------------------------------  Colonoscopy: 09/26/2015  Review of Systems  Constitutional: Negative for appetite change, chills, fatigue and fever.  HENT: Negative for congestion, ear pain, hearing loss, nosebleeds and trouble swallowing.   Eyes: Negative for pain and visual disturbance.  Respiratory: Negative for cough, chest tightness and shortness of breath.   Cardiovascular: Negative for chest pain, palpitations and leg swelling.  Gastrointestinal: Negative for abdominal pain, blood in stool, constipation, diarrhea, nausea and vomiting.  Endocrine: Negative for polydipsia, polyphagia and polyuria.  Genitourinary: Negative for dysuria and flank pain.  Musculoskeletal: Negative for arthralgias, back pain, joint swelling, myalgias and neck stiffness.  Skin: Negative for color change, rash and wound.  Neurological: Negative for dizziness, tremors, seizures, speech difficulty, weakness, light-headedness and headaches.  Psychiatric/Behavioral: Negative for behavioral problems, confusion, decreased concentration, dysphoric mood and sleep disturbance. The patient is not nervous/anxious.     Social History      He  reports that he has never smoked. He has never used smokeless tobacco. He reports current alcohol use of about 3.0 standard drinks of alcohol per week. He reports that he  does not use drugs.       Social History   Socioeconomic History  . Marital status: Married    Spouse name: Not on file  . Number of children: 2  . Years of education: Not on file  . Highest education level: Not on file  Occupational History  . Not on file  Tobacco Use  . Smoking status: Never Smoker  . Smokeless tobacco: Never Used  Substance and Sexual Activity  . Alcohol use: Yes    Alcohol/week: 3.0 standard drinks    Types: 3 Cans of beer per week    Comment: drinks beer on weekends  . Drug use: No  . Sexual activity: Yes    Birth control/protection: None  Other Topics Concern  . Not on file  Social History Narrative  . Not on file   Social Determinants of Health   Financial Resource Strain:   . Difficulty of Paying Living Expenses: Not on file  Food Insecurity:   . Worried About Charity fundraiser in the Last Year: Not on file  . Ran Out of Food in the Last Year: Not on file  Transportation Needs:   . Lack of Transportation (Medical): Not on file  . Lack of Transportation (Non-Medical): Not on file  Physical Activity:   . Days of Exercise per Week: Not on file  . Minutes of Exercise per Session: Not on file  Stress:   . Feeling of Stress : Not on file  Social Connections:   . Frequency of Communication with Friends and Family: Not on file  . Frequency of Social Gatherings with Friends and Family: Not on file  . Attends Religious Services: Not on file  . Active Member of  Clubs or Organizations: Not on file  . Attends Archivist Meetings: Not on file  . Marital Status: Not on file    Past Medical History:  Diagnosis Date  . Allergy   . Arterioloscleroses 2017  . Hyperlipidemia      Patient Active Problem List   Diagnosis Date Noted  . Angiomyolipoma 07/25/2018  . Nephrolithiasis 02/24/2018  . Arteriosclerosis of coronary artery 11/25/2014  . Mixed hyperlipidemia 11/25/2014  . CAD (coronary artery disease) 11/25/2014  . Allergic  rhinitis 10/23/2014  . Borderline diabetes 10/23/2014  . Overweight 10/23/2014  . Dyslipidemia 06/18/2011  . Combined fat and carbohydrate induced hyperlipemia 03/27/2007  . Family history of cardiovascular disease 11/24/2006    Past Surgical History:  Procedure Laterality Date  . COLONOSCOPY WITH PROPOFOL N/A 09/26/2015   Procedure: COLONOSCOPY WITH PROPOFOL;  Surgeon: Lucilla Lame, MD;  Location: [place];  Service: Endoscopy;  Laterality: N/A;  requests early  . HEMORRHOID SURGERY    . LASIK    . POLYPECTOMY  09/26/2015   Procedure: POLYPECTOMY;  Surgeon: Lucilla Lame, MD;  Location: [place];  Service: Endoscopy;;    Family History        Family Status  Relation Name Status  . Mother  Alive  . Father  Deceased at age 7  . Brother  Alive  . Brother  Alive  . Brother  Deceased at age 15        His family history includes Diabetes in his mother; Heart attack in his brother; Heart disease in his mother; Hyperlipidemia in his brother, brother, and mother; Hypertension in his mother; Sarcoidosis in his father.      No Known Allergies   Current Outpatient Medications:  .  aspirin EC 81 MG tablet, Take by mouth., Disp: , Rfl:  .  atorvastatin (LIPITOR) 80 MG tablet, TAKE 1 TABLET BY MOUTH EVERY DAY, Disp: 90 tablet, Rfl: 3 .  fluticasone (FLONASE) 50 MCG/ACT nasal spray, Place 1 spray into both nostrils daily., Disp: 16 g, Rfl: 5 .  icosapent Ethyl (VASCEPA) 1 g capsule, Take 1 g by mouth 2 (two) times daily. , Disp: , Rfl:  .  Multiple Vitamin (MULTIVITAMIN) capsule, Take by mouth., Disp: , Rfl:  .  Probiotic Product (PROBIOTIC DAILY PO), Take by mouth., Disp: , Rfl:  .  VASCEPA 1 g capsule, TAKE 1 CAPSULE (1 G TOTAL) BY MOUTH 2 (TWO) TIMES DAILY., Disp: 180 capsule, Rfl: 3   Patient Care Team: Jerrol Banana., MD as PCP - General (Family Medicine)    Objective:    Vitals: BP 122/88 (BP Location: Left Arm, Patient Position: Sitting, Cuff Size:  Large)   Pulse 77   Temp (!) 97.1 F (36.2 C) (Temporal)   Resp 16   Ht 5' 8.5" (1.74 m)   Wt 196 lb (88.9 kg)   SpO2 96% Comment: room air  BMI 29.37 kg/m    Vitals:   04/04/19 0919  BP: 122/88  Pulse: 77  Resp: 16  Temp: (!) 97.1 F (36.2 C)  TempSrc: Temporal  SpO2: 96%  Weight: 196 lb (88.9 kg)  Height: 5' 8.5" (1.74 m)     Physical Exam Vitals reviewed.  Constitutional:      Appearance: Normal appearance. He is well-developed.  HENT:     Head: Normocephalic and atraumatic.     Right Ear: Tympanic membrane and external ear normal.     Left Ear: Tympanic membrane and external ear normal.  Nose: Nose normal.     Mouth/Throat:     Pharynx: Oropharynx is clear.  Eyes:     General: No scleral icterus.    Conjunctiva/sclera: Conjunctivae normal.  Neck:     Thyroid: No thyromegaly.  Cardiovascular:     Rate and Rhythm: Normal rate and regular rhythm.     Heart sounds: Normal heart sounds.  Pulmonary:     Effort: Pulmonary effort is normal.  Abdominal:     Palpations: Abdomen is soft.     Comments: Patient with a small, nontender umbilical hernia.  Genitourinary:    Penis: Normal.      Testes: Normal.     Comments: Right testicle normal.  Left testicle lower inguinal canal. Lymphadenopathy:     Cervical: No cervical adenopathy.  Skin:    General: Skin is warm and dry.  Neurological:     Mental Status: He is alert and oriented to person, place, and time.  Psychiatric:        Mood and Affect: Mood normal.        Behavior: Behavior normal.        Thought Content: Thought content normal.        Judgment: Judgment normal.      Depression Screen PHQ 2/9 Scores 04/04/2019 03/30/2018 03/30/2018 03/08/2017  PHQ - 2 Score 0 0 0 0  PHQ- 9 Score 2 2 - 2       Assessment & Plan:     Routine Health Maintenance and Physical Exam  Exercise Activities and Dietary recommendations Goals   None     Immunization History  Administered Date(s) Administered  .  Influenza,inj,Quad PF,6+ Mos 12/11/2012, 10/17/2013, 10/24/2014, 03/03/2016, 03/03/2018  . Influenza-Unspecified 11/01/2016  . Td 08/18/2017  . Tdap 07/19/2007    Health Maintenance  Topic Date Due  . HIV Screening  06/15/1979  . INFLUENZA VACCINE  05/02/2019 (Originally 09/02/2018)  . COLONOSCOPY  09/25/2025  . TETANUS/TDAP  08/19/2027     Discussed health benefits of physical activity, and encouraged him to engage in regular exercise appropriate for his age and condition.    -------------------------------------------------------------------- 1. Annual physical exam - CBC with Differential/Platelet - Comprehensive Metabolic Panel (CMET) - TSH - POCT Urinalysis Dipstick  2. Screening for prostate cancer - PSA  3. Hyperlipidemia, unspecified hyperlipidemia type  - Lipid panel  4. Combined fat and carbohydrate induced hyperlipemia - atorvastatin (LIPITOR) 80 MG tablet; Take 1 tablet (80 mg total) by mouth daily.  Dispense: 90 tablet; Refill: 3 5.  Umbiliical hernia Patient wishes to defer surgical referral.  to work on diet and exercise with weight loss  I,Roshena L Chambers,acting as a scribe for [name], MD.,have documented all relevant documentation on the behalf of [name], MD,as directed by  [name], MD while in the presence of [name], MD.  [name], MD  [facility]

## 2019-04-04 ENCOUNTER — Other Ambulatory Visit: Payer: Self-pay

## 2019-04-04 ENCOUNTER — Ambulatory Visit (INDEPENDENT_AMBULATORY_CARE_PROVIDER_SITE_OTHER): Payer: 59 | Admitting: Family Medicine

## 2019-04-04 ENCOUNTER — Encounter: Payer: Self-pay | Admitting: Family Medicine

## 2019-04-04 VITALS — BP 122/88 | HR 77 | Temp 97.1°F | Resp 16 | Ht 68.5 in | Wt 196.0 lb

## 2019-04-04 DIAGNOSIS — Z0000 Encounter for general adult medical examination without abnormal findings: Secondary | ICD-10-CM | POA: Diagnosis not present

## 2019-04-04 DIAGNOSIS — Z125 Encounter for screening for malignant neoplasm of prostate: Secondary | ICD-10-CM | POA: Diagnosis not present

## 2019-04-04 DIAGNOSIS — E785 Hyperlipidemia, unspecified: Secondary | ICD-10-CM

## 2019-04-04 DIAGNOSIS — K429 Umbilical hernia without obstruction or gangrene: Secondary | ICD-10-CM

## 2019-04-04 DIAGNOSIS — E782 Mixed hyperlipidemia: Secondary | ICD-10-CM

## 2019-04-04 LAB — POCT URINALYSIS DIPSTICK
Bilirubin, UA: NEGATIVE
Blood, UA: NEGATIVE
Glucose, UA: NEGATIVE
Ketones, UA: NEGATIVE
Leukocytes, UA: NEGATIVE
Nitrite, UA: NEGATIVE
Protein, UA: NEGATIVE
Spec Grav, UA: 1.025 (ref 1.010–1.025)
Urobilinogen, UA: 0.2 E.U./dL
pH, UA: 6 (ref 5.0–8.0)

## 2019-04-04 MED ORDER — ATORVASTATIN CALCIUM 80 MG PO TABS: 80.0000 mg | ORAL_TABLET | Freq: Every day | ORAL | 3 refills | Status: DC

## 2019-04-05 LAB — COMPREHENSIVE METABOLIC PANEL
ALT: 40 IU/L (ref 0–44)
AST: 28 IU/L (ref 0–40)
Albumin/Globulin Ratio: 2.1 (ref 1.2–2.2)
Albumin: 4.5 g/dL (ref 3.8–4.9)
Alkaline Phosphatase: 106 IU/L (ref 39–117)
BUN/Creatinine Ratio: 13 (ref 9–20)
BUN: 13 mg/dL (ref 6–24)
Bilirubin Total: 1.3 mg/dL — ABNORMAL HIGH (ref 0.0–1.2)
CO2: 23 mmol/L (ref 20–29)
Calcium: 9.2 mg/dL (ref 8.7–10.2)
Chloride: 102 mmol/L (ref 96–106)
Creatinine, Ser: 1.01 mg/dL (ref 0.76–1.27)
GFR calc Af Amer: 97 mL/min/{1.73_m2} (ref >59)
GFR calc non Af Amer: 84 mL/min/{1.73_m2} (ref >59)
Globulin, Total: 2.1 g/dL (ref 1.5–4.5)
Glucose: 104 mg/dL — ABNORMAL HIGH (ref 65–99)
Potassium: 4.7 mmol/L (ref 3.5–5.2)
Sodium: 142 mmol/L (ref 134–144)
Total Protein: 6.6 g/dL (ref 6.0–8.5)

## 2019-04-05 LAB — CBC WITH DIFFERENTIAL/PLATELET
Basophils Absolute: 0 10*3/uL (ref 0.0–0.2)
Basos: 1 %
EOS (ABSOLUTE): 0.1 10*3/uL (ref 0.0–0.4)
Eos: 2 %
Hematocrit: 44.8 % (ref 37.5–51.0)
Hemoglobin: 15.9 g/dL (ref 13.0–17.7)
Immature Grans (Abs): 0 10*3/uL (ref 0.0–0.1)
Immature Granulocytes: 0 %
Lymphocytes Absolute: 1.9 10*3/uL (ref 0.7–3.1)
Lymphs: 31 %
MCH: 31 pg (ref 26.6–33.0)
MCHC: 35.5 g/dL (ref 31.5–35.7)
MCV: 87 fL (ref 79–97)
Monocytes Absolute: 0.6 10*3/uL (ref 0.1–0.9)
Monocytes: 9 %
Neutrophils Absolute: 3.5 10*3/uL (ref 1.4–7.0)
Neutrophils: 57 %
Platelets: 211 10*3/uL (ref 150–450)
RBC: 5.13 x10E6/uL (ref 4.14–5.80)
RDW: 12.8 % (ref 11.6–15.4)
WBC: 6.1 10*3/uL (ref 3.4–10.8)

## 2019-04-05 LAB — LIPID PANEL
Chol/HDL Ratio: 4 ratio (ref 0.0–5.0)
Cholesterol, Total: 121 mg/dL (ref 100–199)
HDL: 30 mg/dL — ABNORMAL LOW (ref >39)
LDL Chol Calc (NIH): 65 mg/dL (ref 0–99)
Triglycerides: 148 mg/dL (ref 0–149)
VLDL Cholesterol Cal: 26 mg/dL (ref 5–40)

## 2019-04-05 LAB — TSH: TSH: 1.57 u[IU]/mL (ref 0.450–4.500)

## 2019-04-05 LAB — PSA: Prostate Specific Ag, Serum: 0.6 ng/mL (ref 0.0–4.0)

## 2019-04-09 ENCOUNTER — Ambulatory Visit: Payer: 59 | Attending: Internal Medicine

## 2019-04-09 DIAGNOSIS — Z23 Encounter for immunization: Secondary | ICD-10-CM | POA: Insufficient documentation

## 2019-04-09 NOTE — Progress Notes (Signed)
   Covid-19 Vaccination Clinic  Name:  Jhoan Schmieder    MRN: [id number] DOB: [birth year]  04/09/2019  Mr. Funari was observed post Covid-19 immunization for 15 minutes without incident. He was provided with Vaccine Information Sheet and instruction to access the V-Safe system.   Mr. Barham was instructed to call 911 with any severe reactions post vaccine: Marland Kitchen Difficulty breathing  . Swelling of face and throat  . A fast heartbeat  . A bad rash all over body  . Dizziness and weakness   Immunizations Administered    Name Date Dose VIS Date Route   Pfizer COVID-19 Vaccine 04/09/2019 10:04 AM 0.3 mL 01/12/2019 Intramuscular   Manufacturer: [place]   Lot: [id number]   [place]: [id number]

## 2019-04-12 ENCOUNTER — Ambulatory Visit: Payer: 59

## 2019-04-23 ENCOUNTER — Telehealth: Payer: Self-pay

## 2019-04-23 NOTE — Telephone Encounter (Signed)
Copied from [place] [id number]. Topic: General - Inquiry >> Apr 23, 2019 10:56 AM Mathis Bud wrote: Reason for CRM: Patient states that he needs recent lab work faxed to his lawyers office.  Patient states at his last CPE he left a form for his PCP to fax over.   FAX [phone] Call back [phone]

## 2019-04-23 NOTE — Telephone Encounter (Signed)
Form and labs faxed.

## 2019-04-25 ENCOUNTER — Other Ambulatory Visit: Payer: Self-pay | Admitting: Family Medicine

## 2019-04-25 NOTE — Telephone Encounter (Signed)
Requested medication (s) are due for refill today: Yes  Requested medication (s) are on the active medication list: Yes  Last refill:  04/03/18  Future visit scheduled: Yes  Notes to clinic:  Prescription has expired.    Requested Prescriptions  Pending Prescriptions Disp Refills   fluticasone (FLONASE) 50 MCG/ACT nasal spray [Pharmacy Med Name: FLUTICASONE PROP 50 MCG SPRAY] 48 mL 1    Sig: Place 1 spray into both nostrils daily.      Ear, Nose, and Throat: Nasal Preparations - Corticosteroids Passed - 04/25/2019 11:40 AM      Passed - Valid encounter within last 12 months    Recent Outpatient Visits           3 weeks ago Annual physical exam   [facility] Jerrol Banana., MD   1 year ago Encounter for annual physical exam   [facility] Jerrol Banana., MD   1 year ago Need for immunization against influenza   [facility], Dionne Bucy, MD   1 year ago Right flank pain   [facility] Jerrol Banana., MD   1 year ago Dyslipidemia   [facility] Jerrol Banana., MD

## 2019-05-02 ENCOUNTER — Ambulatory Visit: Payer: 59 | Attending: Internal Medicine

## 2019-05-02 DIAGNOSIS — Z23 Encounter for immunization: Secondary | ICD-10-CM

## 2019-05-02 NOTE — Progress Notes (Signed)
   Covid-19 Vaccination Clinic  Name:  Richard Adkins    MRN: [id number] DOB: [birth year]  05/02/2019  Mr. Myler was observed post Covid-19 immunization for 15 minutes without incident. He was provided with Vaccine Information Sheet and instruction to access the V-Safe system.   Mr. Face was instructed to call 911 with any severe reactions post vaccine: Marland Kitchen Difficulty breathing  . Swelling of face and throat  . A fast heartbeat  . A bad rash all over body  . Dizziness and weakness   Immunizations Administered    Name Date Dose VIS Date Route   Pfizer COVID-19 Vaccine 05/02/2019  9:59 AM 0.3 mL 01/12/2019 Intramuscular   Manufacturer: [place]   Lot: [id number]   [employer]: [id number]

## 2019-12-22 IMAGING — CT CT ABD-PEL WO/W CM
2 of 6 series · 14 of 46 positions shown, 16 images · IV contrast (iopamidol)
Comparison: Chest CT 04/26/2014.

CLINICAL DATA: Intermittent right flank pain radiating to the
groin. Hematuria.

EXAM:
CT ABDOMEN AND PELVIS WITHOUT AND WITH CONTRAST
TECHNIQUE: Multidetector CT imaging of the abdomen and pelvis was performed
following the standard protocol before and following the bolus
administration of intravenous contrast.
CONTRAST:  125mL FVQQA1-CII IOPAMIDOL (FVQQA1-CII) INJECTION 61%

[Series 8: axial with hematuria with · axial · 0.75mm/px · z∈[-1569,-1159]mm · 11 of 100 slices shown, 13 images]
[im 9/100  soft-tissue]
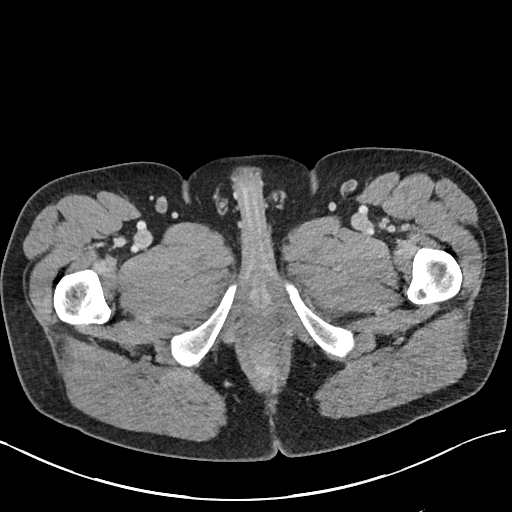
[im 9/100  bone]
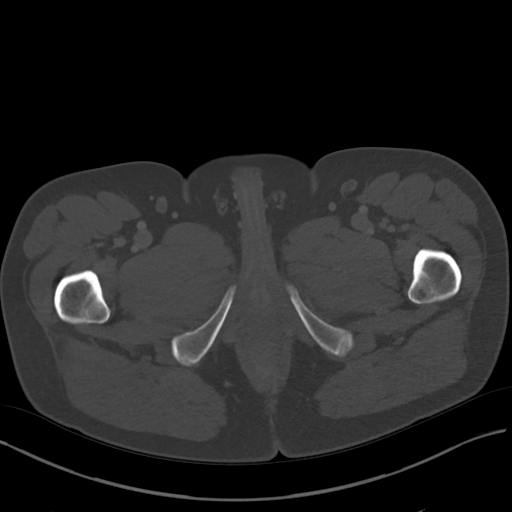
[im 17/100  soft-tissue]
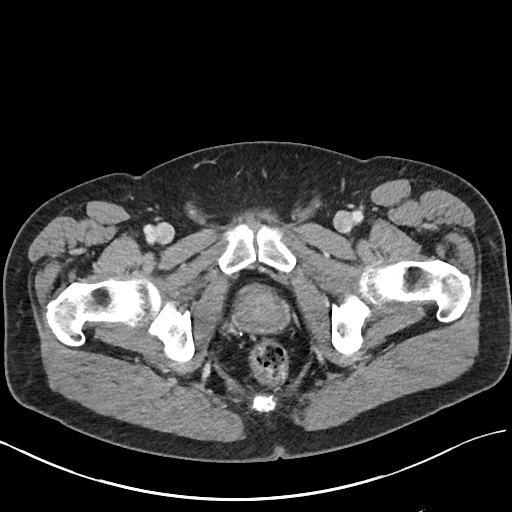
[im 25/100  soft-tissue]
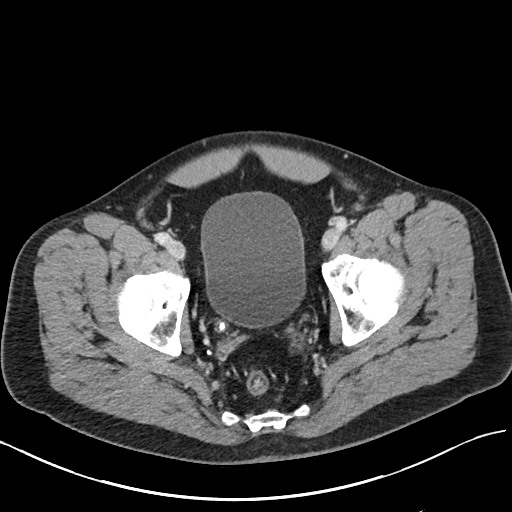
[im 34/100  soft-tissue]
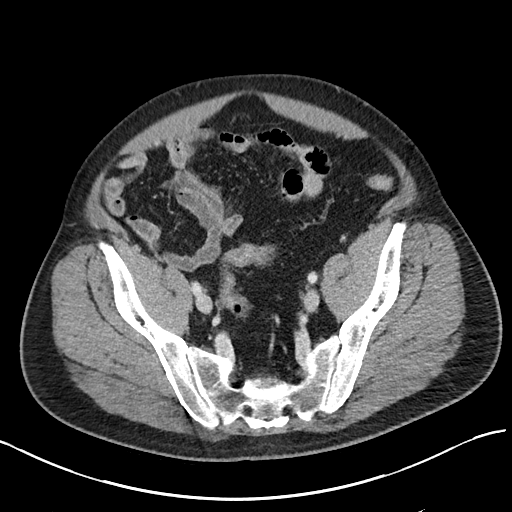
[im 42/100  soft-tissue]
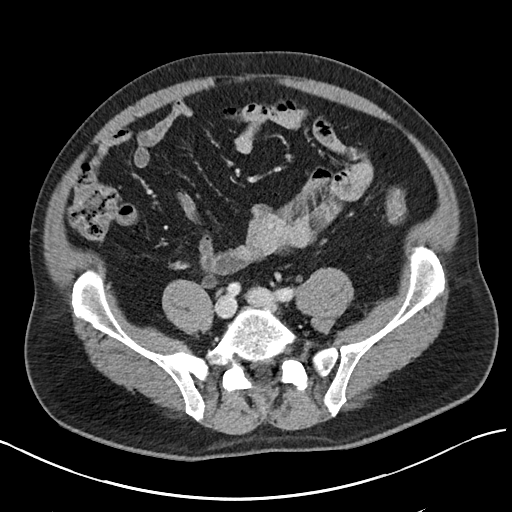
[im 50/100  soft-tissue]
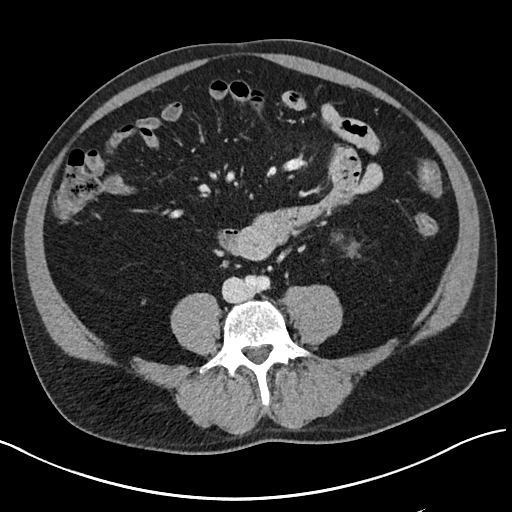
[im 58/100  soft-tissue]
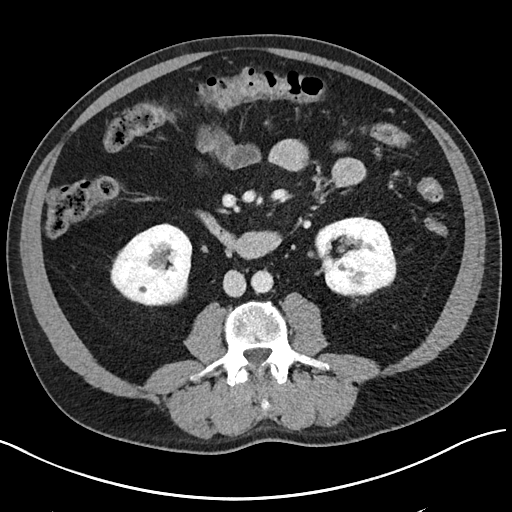
[im 67/100  soft-tissue]
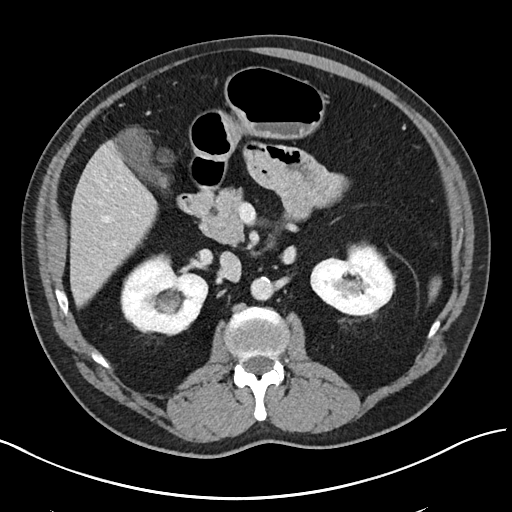
[im 75/100  soft-tissue]
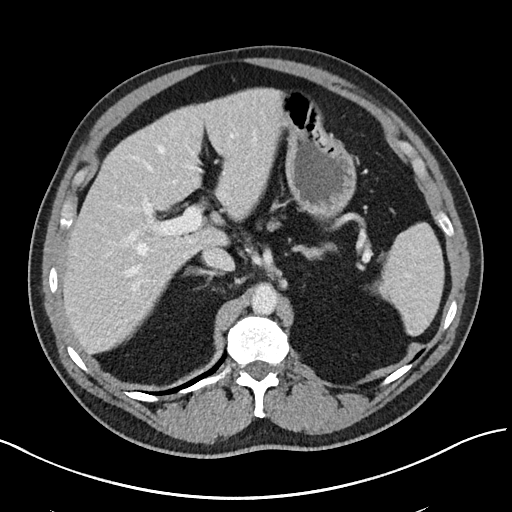
[im 75/100  bone]
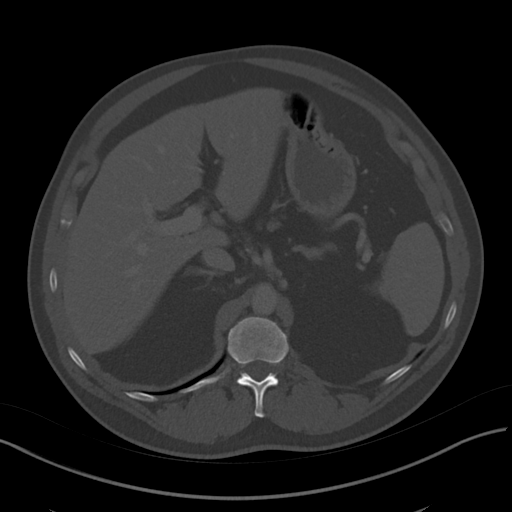
[im 83/100  soft-tissue]
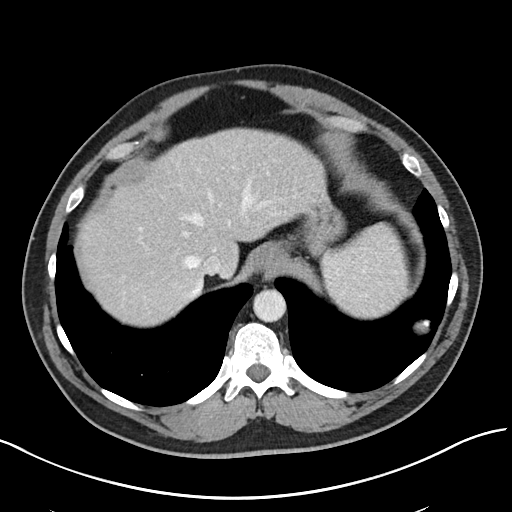
[im 91/100  soft-tissue]
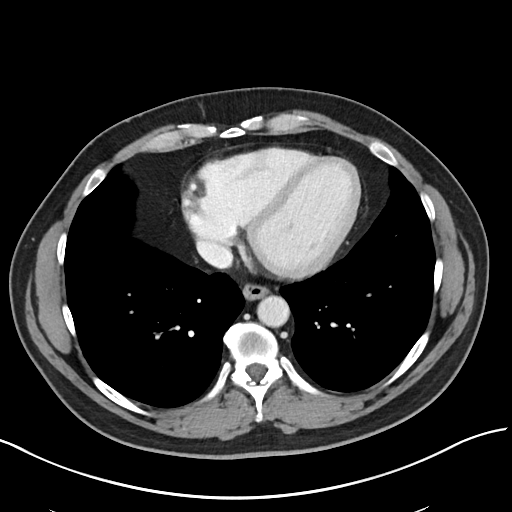

[Series 10: cor with hematuria with · coronal · 0.75mm/px · 3 of 164 slices shown]
[im 55/164  soft-tissue]
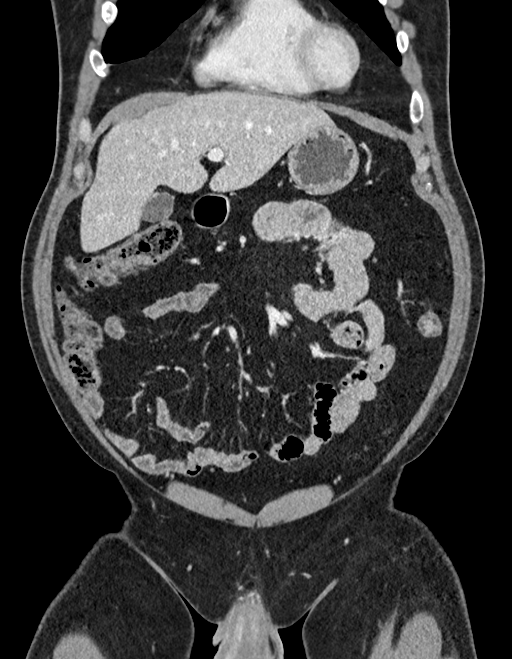
[im 73/164  soft-tissue]
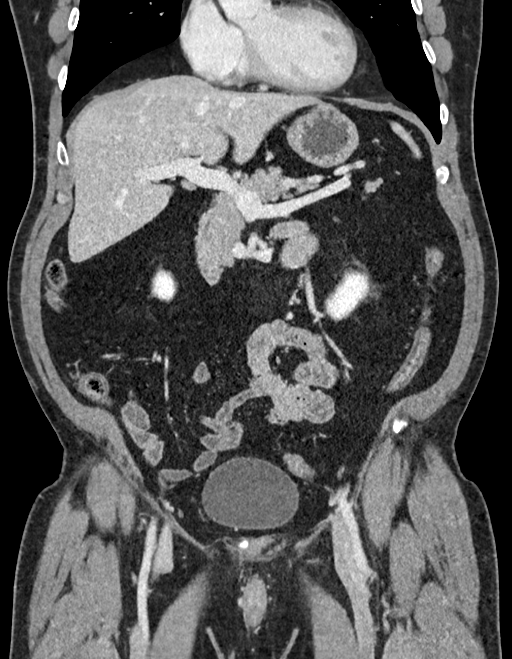
[im 91/164  soft-tissue]
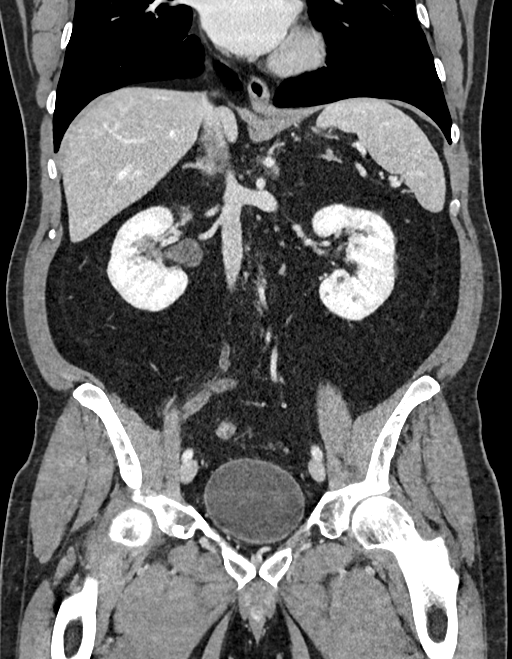

[14 of 46 positions shown; findings below may reference images not displayed]

FINDINGS: Lower chest: 2.6 cm round smoothly marginated subpleural nodule is
identified in the left lower lobe. This as prominent central
calcification. When I remeasure the lesion at the same level and in
a similar fashion on the prior study was 2.4 cm consistent with no
substantial interval change in the nearly 4 year interval.

Hepatobiliary: No focal abnormality within the liver parenchyma.
There is no evidence for gallstones, gallbladder wall thickening, or
pericholecystic fluid. No intrahepatic or extrahepatic biliary
dilation.

Pancreas: No focal mass lesion. No dilatation of the main duct. No
intraparenchymal cyst. No peripancreatic edema.

Spleen: No splenomegaly. No focal mass lesion.

Adrenals/Urinary Tract: No adrenal nodule or mass.

Precontrast imaging shows 2 mm nonobstructing stone in the lower
pole the right kidney (coronal image 87 of series [DATE] x 5 mm stone
identified in the distal right ureter about 1 cm proximal to the
UVJ. There is an adjacent 2 mm stone just proximal to the 5 mm
distal right ureteral stone. 6 mm nonobstructing stone identified in
the interpolar left kidney. No ureteral or bladder stones.

Mild fullness noted of the right intrarenal collecting system. Small
hypoattenuating lesions in each kidney are too small to
characterize. 1.8 cm exophytic lesion posterior left kidney contains
fat attenuation and is probably angiomyolipoma.

Delayed imaging shows no wall thickening or soft tissue filling
defect in either intrarenal collecting system or renal pelvis. Both
ureters are well opacified and show no focal dilatation or soft
tissue mass lesion. No focal bladder wall abnormality evident.

Stomach/Bowel: Stomach is nondistended. No gastric wall thickening.
No evidence of outlet obstruction. Duodenum is normally positioned
as is the ligament of Treitz. No small bowel wall thickening. No
small bowel dilatation. The terminal ileum is normal. The appendix
is normal. No gross colonic mass. No colonic wall thickening.

Vascular/Lymphatic: There is abdominal aortic atherosclerosis
without aneurysm. There is no gastrohepatic or hepatoduodenal
ligament lymphadenopathy. No intraperitoneal or retroperitoneal
lymphadenopathy. No pelvic sidewall lymphadenopathy.

Reproductive: Prostate gland unremarkable.

Other: No intraperitoneal free fluid.

Musculoskeletal: Small sclerotic focus in the anterior right
acetabulum is likely a bone island. No other suspicious or worrisome
lytic/sclerotic osseous abnormality evident. Small paraumbilical
hernia contains only fat.
IMPRESSION: 1. Bilateral nephrolithiasis with mild right hydroureteronephrosis
due to the presence of adjacent distal ureteral stones measuring 5
mm and 2 mm.
2. Bilateral tiny renal lesions too small to characterize with
cm posterior left exophytic lesion demonstrating fat density and
likely representing angiomyolipoma.
3. No urothelial abnormality identified in either kidney, ureter, or
in the bladder.
4. 2.6 cm left lower lobe pulmonary nodule, not substantially
changed since 04/26/2014 and most likely benign. Hamartoma would be
a consideration.

## 2020-01-10 ENCOUNTER — Telehealth: Payer: Self-pay

## 2020-01-10 NOTE — Telephone Encounter (Signed)
Copied from [place] [id number]. Topic: General - Other >> Jan 10, 2020  3:35 PM Keene Breath wrote: Reason for CRM: Patient called to schedule a [employer] vaccine.  Please call patient to schedule at [phone]

## 2020-02-06 ENCOUNTER — Ambulatory Visit: Payer: 59

## 2020-03-07 ENCOUNTER — Telehealth: Payer: Self-pay

## 2020-03-07 NOTE — Telephone Encounter (Signed)
Patient has been taking OTC Nexium.  He wants to get a prescription for Pantoprazole 40 mg. since it will be cheaper w/ a RX.   He realizes this is generic protonix and wants to try this.     [facility] [address]

## 2020-03-07 NOTE — Telephone Encounter (Signed)
Please advise 

## 2020-03-12 NOTE — Telephone Encounter (Signed)
Okay by me.  Thank you

## 2020-03-13 MED ORDER — PANTOPRAZOLE SODIUM 40 MG PO TBEC: 40.0000 mg | DELAYED_RELEASE_TABLET | Freq: Every day | ORAL | 3 refills | Status: DC

## 2020-03-13 NOTE — Telephone Encounter (Signed)
Rx sent to pharmacy   

## 2020-03-25 ENCOUNTER — Other Ambulatory Visit: Payer: Self-pay | Admitting: Family Medicine

## 2020-03-30 ENCOUNTER — Other Ambulatory Visit: Payer: Self-pay | Admitting: Family Medicine

## 2020-03-30 DIAGNOSIS — E785 Hyperlipidemia, unspecified: Secondary | ICD-10-CM

## 2020-03-30 NOTE — Telephone Encounter (Signed)
Requested medication (s) are due for refill today: yes  Requested medication (s) are on the active medication list: yes  Last refill:  04/04/19  Future visit scheduled: yes  Notes to clinic:  med not assigned to a protocol   Requested Prescriptions  Pending Prescriptions Disp Refills   VASCEPA 1 g capsule [Pharmacy Med Name: VASCEPA 1 GM CAPSULE] 180 capsule 3    Sig: TAKE 1 CAPSULE (1 G TOTAL) BY MOUTH 2 (TWO) TIMES DAILY.      Off-Protocol Failed - 03/30/2020  9:06 AM      Failed - Medication not assigned to a protocol, review manually.      Failed - Valid encounter within last 12 months    Recent Outpatient Visits           12 months ago Annual physical exam   [facility] Jerrol Banana., MD   2 years ago Encounter for annual physical exam   [facility] Jerrol Banana., MD   2 years ago Need for immunization against influenza   [facility] Virginia Crews, MD   2 years ago Right flank pain   [facility] Jerrol Banana., MD   2 years ago Dyslipidemia   [facility] Jerrol Banana., MD       Future Appointments             In 1 week Jerrol Banana., MD [facility] [place]

## 2020-04-08 ENCOUNTER — Ambulatory Visit (INDEPENDENT_AMBULATORY_CARE_PROVIDER_SITE_OTHER): Payer: 59 | Admitting: Family Medicine

## 2020-04-08 ENCOUNTER — Other Ambulatory Visit: Payer: Self-pay

## 2020-04-08 ENCOUNTER — Encounter: Payer: Self-pay | Admitting: Family Medicine

## 2020-04-08 VITALS — BP 125/80 | HR 65 | Temp 98.6°F | Resp 16 | Ht 69.0 in | Wt 205.0 lb

## 2020-04-08 DIAGNOSIS — E785 Hyperlipidemia, unspecified: Secondary | ICD-10-CM | POA: Diagnosis not present

## 2020-04-08 DIAGNOSIS — R7303 Prediabetes: Secondary | ICD-10-CM

## 2020-04-08 DIAGNOSIS — K429 Umbilical hernia without obstruction or gangrene: Secondary | ICD-10-CM

## 2020-04-08 DIAGNOSIS — Z125 Encounter for screening for malignant neoplasm of prostate: Secondary | ICD-10-CM | POA: Diagnosis not present

## 2020-04-08 DIAGNOSIS — Z1389 Encounter for screening for other disorder: Secondary | ICD-10-CM | POA: Diagnosis not present

## 2020-04-08 DIAGNOSIS — I2510 Atherosclerotic heart disease of native coronary artery without angina pectoris: Secondary | ICD-10-CM

## 2020-04-08 DIAGNOSIS — Z0000 Encounter for general adult medical examination without abnormal findings: Secondary | ICD-10-CM | POA: Diagnosis not present

## 2020-04-08 DIAGNOSIS — Z1211 Encounter for screening for malignant neoplasm of colon: Secondary | ICD-10-CM

## 2020-04-08 LAB — POCT URINALYSIS DIPSTICK
Bilirubin, UA: NEGATIVE
Blood, UA: NEGATIVE
Glucose, UA: NEGATIVE
Ketones, UA: NEGATIVE
Leukocytes, UA: NEGATIVE
Nitrite, UA: NEGATIVE
Protein, UA: NEGATIVE
Spec Grav, UA: 1.02 (ref 1.010–1.025)
Urobilinogen, UA: 0.2 E.U./dL
pH, UA: 6 (ref 5.0–8.0)

## 2020-04-08 NOTE — Progress Notes (Signed)
I,[name],acting as a scribe for Richard Durie, MD.,have documented all relevant documentation on the behalf of Richard Durie, MD,as directed by  Richard Durie, MD while in the presence of Richard Durie, MD.  Complete physical exam   Patient: Richard Adkins   DOB: 1965-03-04   55 y.o. Male  MRN: [id number] Visit Date: 04/08/2020  Today's healthcare provider: [name], MD   Chief Complaint  Patient presents with  . Annual Exam   Subjective    Richard Adkins is a 55 y.o. male who presents today for a complete physical exam.  He reports consuming a general diet. The patient does not participate in regular exercise at present. He generally feels well. He reports sleeping well. He does not have additional problems to discuss today.  Patient is married and is a father of a 40 year old daughter who is at [employer] and 28 year old son at [employer].  HPI  Patient is on Protonix and needs it to control his reflux. He continues to be concerned about heart problems although is asymptomatic but brother died at 18 with an MI. Patient says his wife tells him he snores at times.  No known apnea.  Past Medical History:  Diagnosis Date  . Allergy   . Arterioloscleroses 2017  . Hyperlipidemia    Past Surgical History:  Procedure Laterality Date  . COLONOSCOPY WITH PROPOFOL N/A 09/26/2015   Procedure: COLONOSCOPY WITH PROPOFOL;  Surgeon: Lucilla Lame, MD;  Location: [place];  Service: Endoscopy;  Laterality: N/A;  requests early  . HEMORRHOID SURGERY    . LASIK    . POLYPECTOMY  09/26/2015   Procedure: POLYPECTOMY;  Surgeon: Lucilla Lame, MD;  Location: [place];  Service: Endoscopy;;   Social History   Socioeconomic History  . Marital status: Married    Spouse name: Not on file  . Number of children: 2  . Years of education: Not on file  . Highest education level: Not on file  Occupational History  . Not on file   Tobacco Use  . Smoking status: Never Smoker  . Smokeless tobacco: Never Used  Vaping Use  . Vaping Use: Never used  Substance and Sexual Activity  . Alcohol use: Yes    Alcohol/week: 3.0 standard drinks    Types: 3 Cans of beer per week    Comment: drinks beer on weekends  . Drug use: No  . Sexual activity: Yes    Birth control/protection: None  Other Topics Concern  . Not on file  Social History Narrative  . Not on file   Social Determinants of Health   Financial Resource Strain: Not on file  Food Insecurity: Not on file  Transportation Needs: Not on file  Physical Activity: Not on file  Stress: Not on file  Social Connections: Not on file  Intimate Partner Violence: Not on file   Family Status  Relation Name Status  . Mother  Alive  . Father  Deceased at age 40  . Brother  Alive  . Brother  Alive  . Brother  Deceased at age 9   Family History  Problem Relation Age of Onset  . Hyperlipidemia Mother   . Hypertension Mother   . Diabetes Mother   . Heart disease Mother   . Sarcoidosis Father   . Hyperlipidemia Brother   . Hyperlipidemia Brother   . Heart attack Brother    No Known Allergies  Patient Care Team: Rosanna Randy,  Retia Passe., MD as PCP - General (Family Medicine)   Medications: Outpatient Medications Prior to Visit  Medication Sig  . aspirin EC 81 MG tablet Take by mouth.  Marland Kitchen atorvastatin (LIPITOR) 80 MG tablet Take 1 tablet (80 mg total) by mouth daily.  . fluticasone (FLONASE) 50 MCG/ACT nasal spray PLACE 1 SPRAY INTO BOTH NOSTRILS DAILY.  . Multiple Vitamin (MULTIVITAMIN) capsule Take by mouth.  . pantoprazole (PROTONIX) 40 MG tablet Take 1 tablet (40 mg total) by mouth daily.  . Probiotic Product (PROBIOTIC DAILY PO) Take by mouth.  Marland Kitchen VASCEPA 1 g capsule TAKE 1 CAPSULE (1 G TOTAL) BY MOUTH 2 (TWO) TIMES DAILY.  Marland Kitchen icosapent Ethyl (VASCEPA) 1 g capsule Take 1 g by mouth 2 (two) times daily.    No facility-administered medications prior to  visit.    Review of Systems  All other systems reviewed and are negative.      Objective    BP 125/80 (BP Location: Left Arm, Patient Position: Sitting, Cuff Size: Large)   Pulse 65   Temp 98.6 F (37 C) (Oral)   Resp 16   Ht 5' 9"  (1.753 m)   Wt 205 lb (93 kg)   SpO2 96%   BMI 30.27 kg/m  BP Readings from Last 3 Encounters:  04/08/20 125/80  04/04/19 122/88  03/30/18 124/81   Wt Readings from Last 3 Encounters:  04/08/20 205 lb (93 kg)  04/04/19 196 lb (88.9 kg)  03/30/18 197 lb 6.4 oz (89.5 kg)      Physical Exam Vitals reviewed.  Constitutional:      Appearance: Normal appearance. He is well-developed.  HENT:     Head: Normocephalic and atraumatic.     Right Ear: Tympanic membrane and external ear normal.     Left Ear: Tympanic membrane and external ear normal.     Nose: Nose normal.     Mouth/Throat:     Pharynx: Oropharynx is clear.  Eyes:     General: No scleral icterus.    Conjunctiva/sclera: Conjunctivae normal.  Neck:     Thyroid: No thyromegaly.  Cardiovascular:     Rate and Rhythm: Normal rate and regular rhythm.     Heart sounds: Normal heart sounds.  Pulmonary:     Effort: Pulmonary effort is normal.  Abdominal:     Palpations: Abdomen is soft.     Comments: Patient with a small, nontender umbilical hernia.  Genitourinary:    Penis: Normal.      Testes: Normal.     Comments: Right testicle normal.  Left testicle lower inguinal canal. Lymphadenopathy:     Cervical: No cervical adenopathy.  Skin:    General: Skin is warm and dry.  Neurological:     Mental Status: He is alert and oriented to person, place, and time.  Psychiatric:        Mood and Affect: Mood normal.        Behavior: Behavior normal.        Thought Content: Thought content normal.        Judgment: Judgment normal.       Last depression screening scores PHQ 2/9 Scores 04/08/2020 04/04/2019 03/30/2018  PHQ - 2 Score 0 0 0  PHQ- 9 Score 2 2 2    Last fall risk  screening Fall Risk  03/30/2018  Falls in the past year? 0   Last Audit-C alcohol use screening Alcohol Use Disorder Test (AUDIT) 04/08/2020  1. How often do you have a drink  containing alcohol? 2  2. How many drinks containing alcohol do you have on a typical day when you are drinking? 1  3. How often do you have six or more drinks on one occasion? 1  AUDIT-C Score 4  4. How often during the last year have you found that you were not able to stop drinking once you had started? 0  5. How often during the last year have you failed to do what was normally expected from you because of drinking? 0  6. How often during the last year have you needed a first drink in the morning to get yourself going after a heavy drinking session? 0  7. How often during the last year have you had a feeling of guilt of remorse after drinking? 0  8. How often during the last year have you been unable to remember what happened the night before because you had been drinking? 0  9. Have you or someone else been injured as a result of your drinking? 0  10. Has a relative or friend or a doctor or another health worker been concerned about your drinking or suggested you cut down? 0  Alcohol Use Disorder Identification Test Final Score (AUDIT) 4  Alcohol Brief Interventions/Follow-up AUDIT Score <7 follow-up not indicated   A score of 3 or more in women, and 4 or more in men indicates increased risk for alcohol abuse, EXCEPT if all of the points are from question 1   No results found for any visits on 04/08/20.  Assessment & Plan    Routine Health Maintenance and Physical Exam  Exercise Activities and Dietary recommendations Goals   None     Immunization History  Administered Date(s) Administered  . Influenza,inj,Quad PF,6+ Mos 12/11/2012, 10/17/2013, 10/24/2014, 03/03/2016, 03/03/2018  . Influenza-Unspecified 11/01/2016  . PFIZER(Purple Top)SARS-COV-2 Vaccination 04/09/2019, 05/02/2019  . Td 08/18/2017  . Tdap  07/19/2007    Health Maintenance  Topic Date Due  . Hepatitis C Screening  Never done  . HIV Screening  Never done  . INFLUENZA VACCINE  09/02/2019  . COLONOSCOPY (Pts 45-86yr Insurance coverage will need to be confirmed)  09/25/2025  . TETANUS/TDAP  08/19/2027  . COVID-19 Vaccine  Completed  . HPV VACCINES  Aged Out    Discussed health benefits of physical activity, and encouraged him to engage in regular exercise appropriate for his age and condition.  1. Annual physical exam  - Lipid panel - TSH - CBC w/Diff/Platelet - Comprehensive Metabolic Panel (CMET) - Hemoglobin A1c  2. Hyperlipidemia, unspecified hyperlipidemia type  - Hemoglobin A1c  3. Screening for prostate cancer  - PSA  4. Screening for blood or protein in urine  - POCT urinalysis dipstick--Normal  5. Screening for colon cancer  - Ambulatory referral to Gastroenterology  6. Borderline diabetes  - Hemoglobin A1c  7. Coronary artery disease, unspecified vessel or lesion type, unspecified whether angina present, unspecified whether native or transplanted heart Family history of heart disease in asymptomatic patient.  Arrange for coronary cardiac CT - CT CARDIAC SCORING (SELF PAY ONLY) 8.  Umbilical hernia Asymptomatic.  Patient declined surgical referral  Return in about 6 months (around 10/09/2020).     I, RWilhemena Durie MD, have reviewed all documentation for this visit. The documentation on 04/11/20 for the exam, diagnosis, procedures, and orders are all accurate and complete.    Richard GCranford Mon MD  [phone](phone) [phone](fax)  [place]

## 2020-04-09 LAB — CBC WITH DIFFERENTIAL/PLATELET
Basophils Absolute: 0 10*3/uL (ref 0.0–0.2)
Basos: 1 %
EOS (ABSOLUTE): 0.1 10*3/uL (ref 0.0–0.4)
Eos: 2 %
Hematocrit: 46.5 % (ref 37.5–51.0)
Hemoglobin: 16.1 g/dL (ref 13.0–17.7)
Immature Grans (Abs): 0 10*3/uL (ref 0.0–0.1)
Immature Granulocytes: 1 %
Lymphocytes Absolute: 1.8 10*3/uL (ref 0.7–3.1)
Lymphs: 28 %
MCH: 30.4 pg (ref 26.6–33.0)
MCHC: 34.6 g/dL (ref 31.5–35.7)
MCV: 88 fL (ref 79–97)
Monocytes Absolute: 0.6 10*3/uL (ref 0.1–0.9)
Monocytes: 10 %
Neutrophils Absolute: 3.7 10*3/uL (ref 1.4–7.0)
Neutrophils: 58 %
Platelets: 213 10*3/uL (ref 150–450)
RBC: 5.3 x10E6/uL (ref 4.14–5.80)
RDW: 12.9 % (ref 11.6–15.4)
WBC: 6.3 10*3/uL (ref 3.4–10.8)

## 2020-04-09 LAB — COMPREHENSIVE METABOLIC PANEL
ALT: 33 IU/L (ref 0–44)
AST: 23 IU/L (ref 0–40)
Albumin/Globulin Ratio: 2.2 (ref 1.2–2.2)
Albumin: 4.4 g/dL (ref 3.8–4.9)
Alkaline Phosphatase: 103 IU/L (ref 44–121)
BUN/Creatinine Ratio: 16 (ref 9–20)
BUN: 15 mg/dL (ref 6–24)
Bilirubin Total: 1.5 mg/dL — ABNORMAL HIGH (ref 0.0–1.2)
CO2: 23 mmol/L (ref 20–29)
Calcium: 9.2 mg/dL (ref 8.7–10.2)
Chloride: 102 mmol/L (ref 96–106)
Creatinine, Ser: 0.92 mg/dL (ref 0.76–1.27)
Globulin, Total: 2 g/dL (ref 1.5–4.5)
Glucose: 113 mg/dL — ABNORMAL HIGH (ref 65–99)
Potassium: 5 mmol/L (ref 3.5–5.2)
Sodium: 140 mmol/L (ref 134–144)
Total Protein: 6.4 g/dL (ref 6.0–8.5)
eGFR: 98 mL/min/{1.73_m2} (ref >59)

## 2020-04-09 LAB — LIPID PANEL
Chol/HDL Ratio: 5.5 ratio — ABNORMAL HIGH (ref 0.0–5.0)
Cholesterol, Total: 137 mg/dL (ref 100–199)
HDL: 25 mg/dL — ABNORMAL LOW (ref >39)
LDL Chol Calc (NIH): 72 mg/dL (ref 0–99)
Triglycerides: 243 mg/dL — ABNORMAL HIGH (ref 0–149)
VLDL Cholesterol Cal: 40 mg/dL (ref 5–40)

## 2020-04-09 LAB — HEMOGLOBIN A1C
Est. average glucose Bld gHb Est-mCnc: 128 mg/dL
Hgb A1c MFr Bld: 6.1 % — ABNORMAL HIGH (ref 4.8–5.6)

## 2020-04-09 LAB — PSA: Prostate Specific Ag, Serum: 0.6 ng/mL (ref 0.0–4.0)

## 2020-04-09 LAB — TSH: TSH: 1.56 u[IU]/mL (ref 0.450–4.500)

## 2020-04-23 ENCOUNTER — Other Ambulatory Visit: Payer: Self-pay

## 2020-04-23 ENCOUNTER — Ambulatory Visit
Admission: RE | Admit: 2020-04-23 | Discharge: 2020-04-23 | Disposition: A | Discharge status: Home / Self Care | Payer: 59 | Source: Ambulatory Visit | Attending: Family Medicine | Admitting: Family Medicine

## 2020-04-23 DIAGNOSIS — I2510 Atherosclerotic heart disease of native coronary artery without angina pectoris: Secondary | ICD-10-CM | POA: Insufficient documentation

## 2020-05-02 ENCOUNTER — Other Ambulatory Visit: Payer: Self-pay

## 2020-05-02 ENCOUNTER — Other Ambulatory Visit: Payer: Self-pay | Admitting: Gastroenterology

## 2020-05-02 ENCOUNTER — Telehealth (INDEPENDENT_AMBULATORY_CARE_PROVIDER_SITE_OTHER): Payer: Self-pay | Admitting: Gastroenterology

## 2020-05-02 DIAGNOSIS — Z86010 Personal history of colonic polyps: Secondary | ICD-10-CM

## 2020-05-02 MED ORDER — NA SULFATE-K SULFATE-MG SULF 17.5-3.13-1.6 GM/177ML PO SOLN: 1.0000 | Freq: Once | ORAL | 0 refills | Status: AC

## 2020-05-02 NOTE — Progress Notes (Signed)
Gastroenterology Pre-Procedure Review  Request Date: Tuesday 05/27/20 Requesting Physician: Dr. Allen Norris  PATIENT REVIEW QUESTIONS: The patient responded to the following health history questions as indicated:    1. Are you having any GI issues? no 2. Do you have a personal history of Polyps? yes (09/26/15 colonoscopy performed by Dr. Allen Norris noted polyps) 3. Do you have a family history of Colon Cancer or Polyps? no 4. Diabetes Mellitus? no 5. Joint replacements in the past 12 months?no 6. Major health problems in the past 3 months?no 7. Any artificial heart valves, MVP, or defibrillator?no    MEDICATIONS & ALLERGIES:    Patient reports the following regarding taking any anticoagulation/antiplatelet therapy:   Plavix, Coumadin, Eliquis, Xarelto, Lovenox, Pradaxa, Brilinta, or Effient? no Aspirin? Yes 81 mg daily  Patient confirms/reports the following medications:  Current Outpatient Medications  Medication Sig Dispense Refill  . aspirin EC 81 MG tablet Take by mouth.    Marland Kitchen atorvastatin (LIPITOR) 80 MG tablet Take 1 tablet (80 mg total) by mouth daily. 90 tablet 3  . fluticasone (FLONASE) 50 MCG/ACT nasal spray PLACE 1 SPRAY INTO BOTH NOSTRILS DAILY. 48 mL 0  . Multiple Vitamin (MULTIVITAMIN) capsule Take by mouth.    . pantoprazole (PROTONIX) 40 MG tablet Take 1 tablet (40 mg total) by mouth daily. 30 tablet 3  . Probiotic Product (PROBIOTIC DAILY PO) Take by mouth.    Marland Kitchen VASCEPA 1 g capsule TAKE 1 CAPSULE (1 G TOTAL) BY MOUTH 2 (TWO) TIMES DAILY. 180 capsule 3   No current facility-administered medications for this visit.    Patient confirms/reports the following allergies:  No Known Allergies  Orders Placed This Encounter  Procedures  . Procedural/ Surgical Case Request: COLONOSCOPY WITH PROPOFOL    Standing Status:   Standing    Number of Occurrences:   1    Order Specific Question:   Pre-op diagnosis    Answer:   personal history of colon polyps    Order Specific Question:    CPT Code    Answer:   97741    AUTHORIZATION INFORMATION Primary Insurance: 1D#: Group #:  Secondary Insurance: 1D#: Group #:  SCHEDULE INFORMATION: Date: 05/27/20 Time: Location: [place]

## 2020-05-05 ENCOUNTER — Telehealth: Payer: Self-pay

## 2020-05-05 NOTE — Telephone Encounter (Signed)
Copied from [place] [id number]. Topic: General - Other >> May 02, 2020 11:10 AM [place], Brayton Layman A wrote: Reason for CRM: Pt wanted a call back from Dr Rosanna Randy or her nurse, pt had some questions regarding a CT scan, Please advise.

## 2020-05-05 NOTE — Telephone Encounter (Signed)
Returned call to pt. LMOVM for cb.

## 2020-05-08 NOTE — Telephone Encounter (Signed)
See result notes. 

## 2020-05-09 ENCOUNTER — Other Ambulatory Visit: Payer: Self-pay

## 2020-05-09 DIAGNOSIS — I2510 Atherosclerotic heart disease of native coronary artery without angina pectoris: Secondary | ICD-10-CM

## 2020-05-09 NOTE — Progress Notes (Signed)
Order placed for cardio referral.

## 2020-05-14 ENCOUNTER — Other Ambulatory Visit: Payer: Self-pay | Admitting: Family Medicine

## 2020-05-14 DIAGNOSIS — E782 Mixed hyperlipidemia: Secondary | ICD-10-CM

## 2020-05-14 NOTE — Telephone Encounter (Signed)
Please review. Thanks!  

## 2020-05-14 NOTE — Telephone Encounter (Signed)
Requested medications are due for refill today.  yes  Requested medications are on the active medications list.  yes  Last refill. 04/04/2019  Future visit scheduled.   yes  Notes to clinic.  Rx is expired.

## 2020-05-15 ENCOUNTER — Other Ambulatory Visit: Payer: Self-pay

## 2020-05-15 MED ORDER — PEG 3350-KCL-NA BICARB-NACL 420 G PO SOLR: 4000.0000 mL | Freq: Once | ORAL | 0 refills | Status: AC

## 2020-05-27 ENCOUNTER — Ambulatory Visit: Payer: 59 | Admitting: Anesthesiology

## 2020-05-27 ENCOUNTER — Ambulatory Visit
Admission: RE | Admit: 2020-05-27 | Discharge: 2020-05-27 | Disposition: A | Discharge status: Home / Self Care | Payer: 59 | Attending: Gastroenterology | Admitting: Gastroenterology

## 2020-05-27 ENCOUNTER — Encounter
Admission: RE | Disposition: A | Discharge status: Home / Self Care | Payer: Self-pay | Source: Home / Self Care | Attending: Gastroenterology

## 2020-05-27 ENCOUNTER — Encounter: Payer: Self-pay | Admitting: Gastroenterology

## 2020-05-27 ENCOUNTER — Other Ambulatory Visit: Payer: Self-pay

## 2020-05-27 DIAGNOSIS — Z1211 Encounter for screening for malignant neoplasm of colon: Secondary | ICD-10-CM | POA: Diagnosis present

## 2020-05-27 DIAGNOSIS — E785 Hyperlipidemia, unspecified: Secondary | ICD-10-CM | POA: Insufficient documentation

## 2020-05-27 DIAGNOSIS — Z7982 Long term (current) use of aspirin: Secondary | ICD-10-CM | POA: Diagnosis not present

## 2020-05-27 DIAGNOSIS — Z8719 Personal history of other diseases of the digestive system: Secondary | ICD-10-CM | POA: Insufficient documentation

## 2020-05-27 DIAGNOSIS — K5120 Ulcerative (chronic) proctitis without complications: Secondary | ICD-10-CM | POA: Diagnosis not present

## 2020-05-27 DIAGNOSIS — Z8249 Family history of ischemic heart disease and other diseases of the circulatory system: Secondary | ICD-10-CM | POA: Diagnosis not present

## 2020-05-27 DIAGNOSIS — Z860100 Personal history of colon polyps, unspecified: Secondary | ICD-10-CM

## 2020-05-27 DIAGNOSIS — K5730 Diverticulosis of large intestine without perforation or abscess without bleeding: Secondary | ICD-10-CM | POA: Diagnosis not present

## 2020-05-27 DIAGNOSIS — K529 Noninfective gastroenteritis and colitis, unspecified: Secondary | ICD-10-CM

## 2020-05-27 DIAGNOSIS — Z79899 Other long term (current) drug therapy: Secondary | ICD-10-CM | POA: Insufficient documentation

## 2020-05-27 HISTORY — PX: COLONOSCOPY WITH PROPOFOL: SHX5780

## 2020-05-27 SURGERY — COLONOSCOPY WITH PROPOFOL
Anesthesia: General

## 2020-05-27 MED ORDER — PROPOFOL 500 MG/50ML IV EMUL
INTRAVENOUS | Status: AC
  Filled 2020-05-27: qty 50

## 2020-05-27 MED ORDER — PROPOFOL 500 MG/50ML IV EMUL
INTRAVENOUS | Status: DC | PRN
  Administered 2020-05-27: 150 ug/kg/min via INTRAVENOUS

## 2020-05-27 MED ORDER — SODIUM CHLORIDE 0.9 % IV SOLN
INTRAVENOUS | Status: DC
Start: 2020-05-27 — End: 2020-05-27

## 2020-05-27 MED ORDER — PROPOFOL 10 MG/ML IV BOLUS
INTRAVENOUS | Status: AC
  Filled 2020-05-27: qty 20

## 2020-05-27 MED ORDER — PROPOFOL 10 MG/ML IV BOLUS
INTRAVENOUS | Status: DC | PRN
  Administered 2020-05-27: 100 mg via INTRAVENOUS

## 2020-05-27 NOTE — Anesthesia Preprocedure Evaluation (Signed)
Anesthesia Evaluation  Patient identified by MRN, date of birth, ID band Patient awake    Reviewed: Allergy & Precautions, H&P , NPO status , Patient's Chart, lab work & pertinent test results, reviewed documented beta blocker date and time   Airway Mallampati: II   Neck ROM: full    Dental  (+) Teeth Intact   Pulmonary neg pulmonary ROS,    Pulmonary exam normal        Cardiovascular Exercise Tolerance: Good negative cardio ROS Normal cardiovascular exam Rhythm:regular Rate:Normal     Neuro/Psych negative neurological ROS  negative psych ROS   GI/Hepatic negative GI ROS, Neg liver ROS,   Endo/Other  negative endocrine ROS  Renal/GU Renal disease  negative genitourinary   Musculoskeletal   Abdominal   Peds  Hematology negative hematology ROS (+)   Anesthesia Other Findings Past Medical History: No date: Allergy 2017: Arterioloscleroses No date: Hyperlipidemia Past Surgical History: 09/26/2015: COLONOSCOPY WITH PROPOFOL; N/A     Comment:  Procedure: COLONOSCOPY WITH PROPOFOL;  Surgeon: Lucilla Lame, MD;  Location: [place];  Service:               Endoscopy;  Laterality: N/A;  requests early No date: HEMORRHOID SURGERY No date: LASIK 09/26/2015: POLYPECTOMY     Comment:  Procedure: POLYPECTOMY;  Surgeon: Lucilla Lame, MD;                Location: [place];  Service: Endoscopy;;   Reproductive/Obstetrics negative OB ROS                             Anesthesia Physical Anesthesia Plan  ASA: III  Anesthesia Plan: General   Post-op Pain Management:    Induction:   PONV Risk Score and Plan:   Airway Management Planned:   Additional Equipment:   Intra-op Plan:   Post-operative Plan:   Informed Consent: I have reviewed the patients History and Physical, chart, labs and discussed the procedure including the risks, benefits and alternatives  for the proposed anesthesia with the patient or authorized representative who has indicated his/her understanding and acceptance.     Dental Advisory Given  Plan Discussed with: CRNA  Anesthesia Plan Comments:         Anesthesia Quick Evaluation

## 2020-05-27 NOTE — Op Note (Signed)
[facility] Gastroenterology Patient Name: Richard Adkins Procedure Date: 05/27/2020 6:52 AM MRN: [id number] Account #: [id number] Date of Birth: 07/16/1965 Admit Type: Outpatient Age: 55 Room: [facility] ENDO ROOM 4 Gender: Male Note Status: Finalized Procedure:             Colonoscopy Indications:           High risk colon cancer surveillance: Personal history                         of colonic polyps Providers:             Lucilla Lame MD, MD Referring MD:          Janine Ores. Rosanna Randy, MD (Referring MD) Medicines:             Propofol per Anesthesia Complications:         No immediate complications. Procedure:             Pre-Anesthesia Assessment:                        - Prior to the procedure, a History and Physical was                         performed, and patient medications and allergies were                         reviewed. The patient's tolerance of previous                         anesthesia was also reviewed. The risks and benefits                         of the procedure and the sedation options and risks                         were discussed with the patient. All questions were                         answered, and informed consent was obtained. Prior                         Anticoagulants: The patient has taken no previous                         anticoagulant or antiplatelet agents. ASA Grade                         Assessment: II - A patient with mild systemic disease.                         After reviewing the risks and benefits, the patient                         was deemed in satisfactory condition to undergo the                         procedure.  After obtaining informed consent, the colonoscope was                         passed under direct vision. Throughout the procedure,                         the patient's blood pressure, pulse, and oxygen                         saturations were monitored continuously. The                          Colonoscope was introduced through the anus and                         advanced to the the terminal ileum. The colonoscopy                         was performed without difficulty. The patient                         tolerated the procedure well. The quality of the bowel                         preparation was excellent. Findings:      The perianal and digital rectal examinations were normal.      Inflammation characterized by erythema and granularity was found. The       rectum was spared. This was mild in severity. Biopsies were taken with a       cold forceps for histology.      Multiple small-mouthed diverticula were found in the sigmoid colon.      Random biopsies were obtained with cold forceps for histology randomly       in the sigmoid colon, in the descending colon and at the splenic flexure.      The terminal ileum appeared normal. Impression:            - Proctitis. Inflammation was found. This was mild in                         severity. Biopsied.                        - Diverticulosis in the sigmoid colon.                        - The examined portion of the ileum was normal.                        - Random biopsies were obtained in the sigmoid colon,                         in the descending colon and at the splenic flexure. Recommendation:        - Discharge patient to home.                        - Resume previous diet.                        -  Continue present medications.                        - Await pathology results. Procedure Code(s):     --- Professional ---                        [phone], Colonoscopy, flexible; with biopsy, single or                         multiple Diagnosis Code(s):     --- Professional ---                        Z86.010, Personal history of colonic polyps                        K52.9, Noninfective gastroenteritis and colitis,                         unspecified CPT copyright 2019 American Medical Association. All rights  reserved. The codes documented in this report are preliminary and upon coder review may  be revised to meet current compliance requirements. Lucilla Lame MD, MD 05/27/2020 8:03:57 AM This report has been signed electronically. Number of Addenda: 0 Note Initiated On: 05/27/2020 6:52 AM Scope Withdrawal Time: 0 hours 6 minutes 17 seconds  Total Procedure Duration: 0 hours 13 minutes 42 seconds  Estimated Blood Loss:  Estimated blood loss: none.      [facility]

## 2020-05-27 NOTE — H&P (Signed)
Richard Lame, MD [address] [place], [place] [address] Phone:[phone] Fax : [phone]  Primary Care Physician:  Richard Banana., MD Primary Gastroenterologist:  Dr. Allen Norris  Pre-Procedure History & Physical: HPI:  Richard Adkins is a 55 y.o. male is here for an colonoscopy.   Past Medical History:  Diagnosis Date  . Allergy   . Arterioloscleroses 2017  . Hyperlipidemia     Past Surgical History:  Procedure Laterality Date  . COLONOSCOPY WITH PROPOFOL N/A 09/26/2015   Procedure: COLONOSCOPY WITH PROPOFOL;  Surgeon: Richard Lame, MD;  Location: [place];  Service: Endoscopy;  Laterality: N/A;  requests early  . HEMORRHOID SURGERY    . LASIK    . POLYPECTOMY  09/26/2015   Procedure: POLYPECTOMY;  Surgeon: Richard Lame, MD;  Location: [place];  Service: Endoscopy;;    Prior to Admission medications   Medication Sig Start Date End Date Taking? Authorizing Provider  aspirin EC 81 MG tablet Take by mouth.    [provider]  atorvastatin (LIPITOR) 80 MG tablet TAKE 1 TABLET BY MOUTH EVERY DAY 05/14/20   Richard Banana., MD  fluticasone [facility]) 50 MCG/ACT nasal spray PLACE 1 SPRAY INTO BOTH NOSTRILS DAILY. 03/25/20   Richard Banana., MD  Multiple Vitamin (MULTIVITAMIN) capsule Take by mouth.    [provider]  pantoprazole (PROTONIX) 40 MG tablet Take 1 tablet (40 mg total) by mouth daily. 03/13/20   Richard Banana., MD  Probiotic Product (PROBIOTIC DAILY PO) Take by mouth.    [provider]  VASCEPA 1 g capsule TAKE 1 CAPSULE (1 G TOTAL) BY MOUTH 2 (TWO) TIMES DAILY. 03/31/20   Richard Banana., MD    Allergies as of 05/02/2020  . (No Known Allergies)    Family History  Problem Relation Age of Onset  . Hyperlipidemia Mother   . Hypertension Mother   . Diabetes Mother   . Heart disease Mother   . Sarcoidosis Father   . Hyperlipidemia Brother   . Hyperlipidemia Brother   .  Heart attack Brother     Social History   Socioeconomic History  . Marital status: Married    Spouse name: Not on file  . Number of children: 2  . Years of education: Not on file  . Highest education level: Not on file  Occupational History  . Not on file  Tobacco Use  . Smoking status: Never Smoker  . Smokeless tobacco: Never Used  Vaping Use  . Vaping Use: Never used  Substance and Sexual Activity  . Alcohol use: Yes    Alcohol/week: 3.0 standard drinks    Types: 3 Cans of beer per week    Comment: drinks beer on weekends  . Drug use: No  . Sexual activity: Yes    Birth control/protection: None  Other Topics Concern  . Not on file  Social History Narrative  . Not on file   Social Determinants of Health   Financial Resource Strain: Not on file  Food Insecurity: Not on file  Transportation Needs: Not on file  Physical Activity: Not on file  Stress: Not on file  Social Connections: Not on file  Intimate Partner Violence: Not on file    Review of Systems: See HPI, otherwise negative ROS  Physical Exam: There were no vitals taken for this visit. General:   Alert,  pleasant and cooperative in NAD Head:  Normocephalic and atraumatic. Neck:  Supple;  no masses or thyromegaly. Lungs:  Clear throughout to auscultation.    Heart:  Regular rate and rhythm. Abdomen:  Soft, nontender and nondistended. Normal bowel sounds, without guarding, and without rebound.   Neurologic:  Alert and  oriented x4;  grossly normal neurologically.  Impression/Plan: Richard Adkins is here for an colonoscopy to be performed for antidepressant 09/2015   Risks, benefits, limitations, and alternatives regarding  colonoscopy have been reviewed with the patient.  Questions have been answered.  All parties agreeable.   Richard Lame, MD  05/27/2020, 7:23 AM

## 2020-05-27 NOTE — Transfer of Care (Signed)
Immediate Anesthesia Transfer of Care Note  Patient: Richard Adkins  Procedure(s) Performed: COLONOSCOPY WITH PROPOFOL (N/A )  Patient Location: PACU and Endoscopy Unit  Anesthesia Type:General  Level of Consciousness: drowsy  Airway & Oxygen Therapy: Patient Spontanous Breathing  Post-op Assessment: Report given to RN  Post vital signs: stable  Last Vitals:  Vitals Value Taken Time  BP    Temp    Pulse    Resp    SpO2      Last Pain:  Vitals:   05/27/20 0727  TempSrc: Temporal  PainSc: 0-No pain         Complications: No complications documented.

## 2020-05-28 ENCOUNTER — Encounter: Payer: Self-pay | Admitting: Gastroenterology

## 2020-05-28 LAB — SURGICAL PATHOLOGY

## 2020-05-29 NOTE — Anesthesia Postprocedure Evaluation (Signed)
Anesthesia Post Note  Patient: Trevaris Pennella  Procedure(s) Performed: COLONOSCOPY WITH PROPOFOL (N/A )  Patient location during evaluation: PACU Anesthesia Type: General Level of consciousness: awake and alert Pain management: pain level controlled Vital Signs Assessment: post-procedure vital signs reviewed and stable Respiratory status: spontaneous breathing, nonlabored ventilation, respiratory function stable and patient connected to nasal cannula oxygen Cardiovascular status: blood pressure returned to baseline and stable Postop Assessment: no apparent nausea or vomiting Anesthetic complications: no   No complications documented.   Last Vitals:  Vitals:   05/27/20 0815 05/27/20 0825  BP: 120/80 126/84  Pulse: 69 65  Resp: 20 20  Temp:    SpO2: 97% 98%    Last Pain:  Vitals:   05/28/20 0738  TempSrc:   PainSc: 0-No pain                 Molli Barrows

## 2020-05-30 ENCOUNTER — Telehealth: Payer: Self-pay

## 2020-05-30 NOTE — Telephone Encounter (Signed)
-----   Message from Lucilla Lame, MD sent at 05/29/2020  8:22 AM EDT ----- Please have the patient come in for a follow up.

## 2020-05-30 NOTE — Telephone Encounter (Signed)
Pt has been scheduled for a follow up appt to discuss colonoscopy results.

## 2020-06-10 ENCOUNTER — Other Ambulatory Visit: Payer: Self-pay

## 2020-06-10 ENCOUNTER — Encounter: Payer: Self-pay | Admitting: Gastroenterology

## 2020-06-10 ENCOUNTER — Ambulatory Visit (INDEPENDENT_AMBULATORY_CARE_PROVIDER_SITE_OTHER): Payer: 59 | Admitting: Gastroenterology

## 2020-06-10 VITALS — BP 124/77 | HR 81 | Ht 69.0 in | Wt 200.0 lb

## 2020-06-10 DIAGNOSIS — K51019 Ulcerative (chronic) pancolitis with unspecified complications: Secondary | ICD-10-CM

## 2020-06-10 MED ORDER — MESALAMINE 1.2 G PO TBEC: 2.4000 g | DELAYED_RELEASE_TABLET | Freq: Every day | ORAL | 11 refills | Status: DC

## 2020-06-10 NOTE — Progress Notes (Signed)
    Primary Care Physician: Jerrol Banana., MD  Primary Gastroenterologist:  Dr. Lucilla Lame  Chief Complaint  Patient presents with  . Follow-up    HPI: Richard Adkins is a 55 y.o. male here for follow-up after having a colonoscopy.  The patient had mildly active proctitis with chronic colitis seen in the left colon. The patient reports that he is asymptomatic despite having the activity seen on the biopsy.  The patient has reported the past that he has a hard time keeping the suppositories in when he uses [facility].  He is not having any symptoms at the present time.  Past Medical History:  Diagnosis Date  . Allergy   . Arterioloscleroses 2017  . Hyperlipidemia     Current Outpatient Medications  Medication Sig Dispense Refill  . aspirin EC 81 MG tablet Take by mouth.    Marland Kitchen atorvastatin (LIPITOR) 80 MG tablet TAKE 1 TABLET BY MOUTH EVERY DAY 90 tablet 3  . fluticasone (FLONASE) 50 MCG/ACT nasal spray PLACE 1 SPRAY INTO BOTH NOSTRILS DAILY. 48 mL 0  . mesalamine (LIALDA) 1.2 g EC tablet Take 2 tablets (2.4 g total) by mouth daily with breakfast. 60 tablet 11  . Multiple Vitamin (MULTIVITAMIN) capsule Take by mouth.    . pantoprazole (PROTONIX) 40 MG tablet Take 1 tablet (40 mg total) by mouth daily. 30 tablet 3  . Probiotic Product (PROBIOTIC DAILY PO) Take by mouth.    Marland Kitchen VASCEPA 1 g capsule TAKE 1 CAPSULE (1 G TOTAL) BY MOUTH 2 (TWO) TIMES DAILY. 180 capsule 3   No current facility-administered medications for this visit.    Allergies as of 06/10/2020  . (No Known Allergies)    ROS:  General: Negative for anorexia, weight loss, fever, chills, fatigue, weakness. ENT: Negative for hoarseness, difficulty swallowing , nasal congestion. CV: Negative for chest pain, angina, palpitations, dyspnea on exertion, peripheral edema.  Respiratory: Negative for dyspnea at rest, dyspnea on exertion, cough, sputum, wheezing.  GI: See history of present illness. GU:  Negative for  dysuria, hematuria, urinary incontinence, urinary frequency, nocturnal urination.  Endo: Negative for unusual weight change.    Physical Examination:   BP 124/77   Pulse 81   Ht 5' 9"  (1.753 m)   Wt 200 lb (90.7 kg)   BMI 29.53 kg/m   General: Well-nourished, well-developed in no acute distress.  Eyes: No icterus. Conjunctivae pink. Neuro: Alert and oriented x 3.  Grossly intact. Skin: Warm and dry, no jaundice.   Psych: Alert and cooperative, normal mood and affect.  Labs:    Imaging Studies: No results found.  Assessment and Plan:   Richard Adkins is a 54 y.o. y/o male who comes in today with a history of ulcerative proctitis with biopsy showing left-sided colitis during his most recent colonoscopy. The patient will need a repeat colonoscopy in 3 years due to his findings of at least left-sided colitis and likely pancolitis.  The patient will also be started on Lialda due to his recent findings on biopsies.  The patient has been explained the plan and agrees with it.     Lucilla Lame, MD. Marval Regal    Note: This dictation was prepared with Dragon dictation along with smaller phrase technology. Any transcriptional errors that result from this process are unintentional.

## 2020-06-15 ENCOUNTER — Other Ambulatory Visit: Payer: Self-pay | Admitting: Family Medicine

## 2020-06-23 ENCOUNTER — Telehealth: Payer: Self-pay | Admitting: Gastroenterology

## 2020-06-23 NOTE — Telephone Encounter (Signed)
Returned pt's call. Pt read online that there was an issue with the Lialda and long term sun exposure. Advise pt to contact the pharmacy to discuss with the Pharmacist. If there is a problem, pt is to call back.

## 2020-06-23 NOTE — Telephone Encounter (Signed)
Patient asked for call back. He has questions regarding medication

## 2020-06-25 ENCOUNTER — Other Ambulatory Visit: Payer: Self-pay | Admitting: Family Medicine

## 2020-06-25 NOTE — Telephone Encounter (Signed)
Requested Prescriptions  Pending Prescriptions Disp Refills  . fluticasone (FLONASE) 50 MCG/ACT nasal spray [Pharmacy Med Name: FLUTICASONE PROP 50 MCG SPRAY] 48 mL 2    Sig: SPRAY 1 SPRAY INTO BOTH NOSTRILS DAILY.     Ear, Nose, and Throat: Nasal Preparations - Corticosteroids Passed - 06/25/2020  7:48 PM      Passed - Valid encounter within last 12 months    Recent Outpatient Visits          2 months ago Annual physical exam   [facility] Jerrol Banana., MD   1 year ago Annual physical exam   [facility] Jerrol Banana., MD   2 years ago Encounter for annual physical exam   [facility] Jerrol Banana., MD   2 years ago Need for immunization against influenza   [facility] Virginia Crews, MD   2 years ago Right flank pain   [facility] Jerrol Banana., MD      Future Appointments            In 1 month Gollan, Kathlene November, MD [facility], [facility]   In 3 months Jerrol Banana., MD [facility], [place]

## 2020-06-29 ENCOUNTER — Other Ambulatory Visit: Payer: Self-pay | Admitting: Family Medicine

## 2020-06-29 NOTE — Telephone Encounter (Signed)
Requested Prescriptions  Pending Prescriptions Disp Refills  . pantoprazole (PROTONIX) 40 MG tablet [Pharmacy Med Name: PANTOPRAZOLE SOD DR 40 MG TAB] 90 tablet 1    Sig: TAKE 1 TABLET BY MOUTH EVERY DAY     Gastroenterology: Proton Pump Inhibitors Passed - 06/29/2020 12:52 PM      Passed - Valid encounter within last 12 months    Recent Outpatient Visits          2 months ago Annual physical exam   [facility] Jerrol Banana., MD   1 year ago Annual physical exam   [facility] Jerrol Banana., MD   2 years ago Encounter for annual physical exam   [facility] Jerrol Banana., MD   2 years ago Need for immunization against influenza   [facility] Virginia Crews, MD   2 years ago Right flank pain   [facility] Jerrol Banana., MD      Future Appointments            In 1 month Gollan, Kathlene November, MD [facility], [facility]   In 3 months Jerrol Banana., MD [facility], [place]

## 2020-08-13 ENCOUNTER — Encounter: Payer: Self-pay | Admitting: Cardiovascular Disease

## 2020-08-13 ENCOUNTER — Other Ambulatory Visit: Payer: Self-pay

## 2020-08-13 ENCOUNTER — Ambulatory Visit (INDEPENDENT_AMBULATORY_CARE_PROVIDER_SITE_OTHER): Payer: 59 | Admitting: Cardiovascular Disease

## 2020-08-13 VITALS — BP 128/82 | HR 84 | Ht 69.0 in | Wt 199.2 lb

## 2020-08-13 DIAGNOSIS — I2510 Atherosclerotic heart disease of native coronary artery without angina pectoris: Secondary | ICD-10-CM

## 2020-08-13 DIAGNOSIS — R7303 Prediabetes: Secondary | ICD-10-CM | POA: Diagnosis not present

## 2020-08-13 DIAGNOSIS — E663 Overweight: Secondary | ICD-10-CM | POA: Diagnosis not present

## 2020-08-13 DIAGNOSIS — E785 Hyperlipidemia, unspecified: Secondary | ICD-10-CM | POA: Diagnosis not present

## 2020-08-13 MED ORDER — EZETIMIBE 10 MG PO TABS: 10.0000 mg | ORAL_TABLET | Freq: Every day | ORAL | 3 refills | Status: DC

## 2020-08-13 NOTE — Progress Notes (Signed)
Cardiology Office Note  Date:  08/13/2020   ID:  Antonino Nienhuis, DOB 30-Aug-1965, MRN [id number]  PCP:  Jerrol Banana., MD   Chief Complaint  Patient presents with   New Patient (Initial Visit)    Ref by Dr. Rosanna Randy for follow up of [place] test. "Doing well."  Medications reviewed by the patient verbally.     HPI:  Ms. Richard Adkins is a 55 year old gentleman with past medical history of Ulcerative pancolitis with complications Referred by Dr. 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 No regular exercise program, on the road quite a bit No chest pain concerning for angina  Strong family history coronary disease, brother with MI at early age  Imaging studies reviewed CT coronary calcium scoring March 2016 score 159, predominantly in the LAD Repeat calcium scoring March 2022 calcium score 391  Lab work reviewed  hemoglobin A1c 6.1, slow trend upward Total cholesterol 137 LDL 72, has been stable for 4 years On lipitor 10 years  EKG personally reviewed by myself on todays visit Shows normal sinus rhythm rate 84 bpm no significant ST-T wave changes   PMH:   has a past medical history of Allergy, Arterioloscleroses (2017), and Hyperlipidemia.  PSH:    Past Surgical History:  Procedure Laterality Date   COLONOSCOPY WITH PROPOFOL N/A 09/26/2015   Procedure: COLONOSCOPY WITH PROPOFOL;  Surgeon: Lucilla Lame, MD;  Location: [place];  Service: Endoscopy;  Laterality: N/A;  requests early   COLONOSCOPY WITH PROPOFOL N/A 05/27/2020   Procedure: COLONOSCOPY WITH PROPOFOL;  Surgeon: Lucilla Lame, MD;  Location: [facility] ENDOSCOPY;  Service: Endoscopy;  Laterality: N/A;   HEMORRHOID SURGERY     LASIK     POLYPECTOMY  09/26/2015   Procedure: POLYPECTOMY;  Surgeon: Lucilla Lame, MD;  Location: [place];  Service: Endoscopy;;    Current Outpatient Medications  Medication Sig Dispense Refill    aspirin EC 81 MG tablet Take by mouth.     atorvastatin (LIPITOR) 80 MG tablet TAKE 1 TABLET BY MOUTH EVERY DAY 90 tablet 3   ezetimibe (ZETIA) 10 MG tablet Take 1 tablet (10 mg total) by mouth daily. 90 tablet 3   fluticasone (FLONASE) 50 MCG/ACT nasal spray SPRAY 1 SPRAY INTO BOTH NOSTRILS DAILY. 48 mL 2   mesalamine (LIALDA) 1.2 g EC tablet Take 2 tablets (2.4 g total) by mouth daily with breakfast. 60 tablet 11   Multiple Vitamin (MULTIVITAMIN) capsule Take by mouth.     pantoprazole (PROTONIX) 40 MG tablet TAKE 1 TABLET BY MOUTH EVERY DAY 90 tablet 1   Probiotic Product (PROBIOTIC DAILY PO) Take by mouth.     VASCEPA 1 g capsule TAKE 1 CAPSULE (1 G TOTAL) BY MOUTH 2 (TWO) TIMES DAILY. 180 capsule 3   No current facility-administered medications for this visit.     Allergies:   Patient has no known allergies.   Social History:  The patient  reports that he has never smoked. He has never used smokeless tobacco. He reports current alcohol use of about 3.0 standard drinks of alcohol per week. He reports that he does not use drugs.   Family History:   family history includes Diabetes in his mother; Heart attack in his brother; Heart disease in his mother; Hyperlipidemia in his brother, brother, and mother; Hypertension in his mother; Sarcoidosis in his father.    Review of Systems: Review of Systems  Constitutional: Negative.  HENT: Negative.    Respiratory: Negative.    Cardiovascular: Negative.   Gastrointestinal: Negative.   Musculoskeletal: Negative.   Neurological: Negative.   Psychiatric/Behavioral: Negative.    All other systems reviewed and are negative.   PHYSICAL EXAM: VS:  BP 128/82 (BP Location: Right Arm, Patient Position: Sitting, Cuff Size: Normal)   Pulse 84   Ht 5' 9"  (1.753 m)   Wt 199 lb 4 oz (90.4 kg)   SpO2 98%   BMI 29.42 kg/m  , BMI Body mass index is 29.42 kg/m. GEN: Well nourished, well developed, in no acute distress HEENT: normal Neck: no  JVD, carotid bruits, or masses Cardiac: RRR; no murmurs, rubs, or gallops,no edema  Respiratory:  clear to auscultation bilaterally, normal work of breathing GI: soft, nontender, nondistended, + BS MS: no deformity or atrophy Skin: warm and dry, no rash Neuro:  Strength and sensation are intact Psych: euthymic mood, full affect   Recent Labs: 04/08/2020: ALT 33; BUN 15; Creatinine, Ser 0.92; Hemoglobin 16.1; Platelets 213; Potassium 5.0; Sodium 140; TSH 1.560    Lipid Panel Lab Results  Component Value Date   CHOL 137 04/08/2020   HDL 25 (L) 04/08/2020   LDLCALC 72 04/08/2020   TRIG 243 (H) 04/08/2020      Wt Readings from Last 3 Encounters:  08/13/20 199 lb 4 oz (90.4 kg)  06/10/20 200 lb (90.7 kg)  05/27/20 200 lb (90.7 kg)      ASSESSMENT AND PLAN:  Problem List Items Addressed This Visit     Dyslipidemia   Relevant Medications   ezetimibe (ZETIA) 10 MG tablet   Other Relevant Orders   EKG 12-Lead   Borderline diabetes   Overweight   Other Visit Diagnoses     Coronary artery calcification seen on CAT scan    -  Primary   Relevant Medications   ezetimibe (ZETIA) 10 MG tablet   Other Relevant Orders   EKG 12-Lead      Coronary calcification Noted on CT scan 2016 and 2022 with increasing score Asymptomatic, no anginal symptoms Recommended to stay on Lipitor 80 daily, Will add Zetia 10 mg daily Goal LDL less than 60 Last time modification recommended, walking program, dietary changes  Borderline diabetes We have encouraged continued exercise, careful diet management in an effort to lose weight.  Hyperlipidemia Continue Lipitor 80 daily, add Zetia If numbers above desired goals, could change Lipitor to Crestor 40    Total encounter time more than 60 minutes  Greater than 50% was spent in counseling and coordination of care with the patient    Signed, Esmond Plants, M.D., Ph.D. [place], [place]

## 2020-08-13 NOTE — Patient Instructions (Addendum)
Medication Instructions:  Add zetia 10 mg daily  If you need a refill on your cardiac medications before your next appointment, please call your pharmacy.    Lab work: No new labs needed  Next lab draw consider NMR lipoprofile CRP Lipoprotein (a)  Testing/Procedures: No new testing needed  Follow-Up: At [facility], you and your health needs are our priority.  As part of our continuing mission to provide you with exceptional heart care, we have created designated Provider Care Teams.  These Care Teams include your primary Cardiologist (physician) and Advanced Practice Providers (APPs -  Physician Assistants and Nurse Practitioners) who all work together to provide you with the care you need, when you need it.  You will need a follow up appointment in 12 months  Providers on your designated Care Team:   Murray Hodgkins, NP Christell Faith, PA-C Marrianne Mood, PA-C [place], [place]  COVID-19 Vaccine Information can be found at: [email or web address].uk For questions related to vaccine distribution or appointments, please email vaccine@[email or web address] or call [phone].

## 2020-10-09 ENCOUNTER — Other Ambulatory Visit: Payer: Self-pay

## 2020-10-09 ENCOUNTER — Encounter: Payer: Self-pay | Admitting: Family Medicine

## 2020-10-09 ENCOUNTER — Ambulatory Visit (INDEPENDENT_AMBULATORY_CARE_PROVIDER_SITE_OTHER): Payer: 59 | Admitting: Family Medicine

## 2020-10-09 VITALS — BP 122/87 | HR 79 | Temp 97.5°F | Resp 16 | Ht 69.0 in | Wt 201.0 lb

## 2020-10-09 DIAGNOSIS — R7303 Prediabetes: Secondary | ICD-10-CM

## 2020-10-09 DIAGNOSIS — E785 Hyperlipidemia, unspecified: Secondary | ICD-10-CM | POA: Diagnosis not present

## 2020-10-09 DIAGNOSIS — K429 Umbilical hernia without obstruction or gangrene: Secondary | ICD-10-CM

## 2020-10-09 DIAGNOSIS — M25551 Pain in right hip: Secondary | ICD-10-CM | POA: Diagnosis not present

## 2020-10-09 DIAGNOSIS — Z1283 Encounter for screening for malignant neoplasm of skin: Secondary | ICD-10-CM

## 2020-10-09 NOTE — Progress Notes (Signed)
I,[name],acting as a scribe for Richard Durie, MD.,have documented all relevant documentation on the behalf of Richard Durie, MD,as directed by  Richard Durie, MD while in the presence of Richard Durie, MD.   Established patient visit   Patient: Richard Adkins   DOB: May 15, 1965   55 y.o. Male  MRN: [id number] Visit Date: 10/09/2020  Today's healthcare provider: [name], MD   Chief Complaint  Patient presents with   Follow-up   Hyperlipidemia   Subjective    HPI  Patient is feeling well lately but does have a couple of issues.  Cardiology wants some labs tested that are specific for lipids.  Patient has a small umbilical hernia and has some right groin and hip pain that is worse with weightbearing. Lipid/Cholesterol, Follow-up  Last lipid panel Other pertinent labs  Lab Results  Component Value Date   CHOL 137 04/08/2020   HDL 25 (L) 04/08/2020   LDLCALC 72 04/08/2020   TRIG 243 (H) 04/08/2020   CHOLHDL 5.5 (H) 04/08/2020   Lab Results  Component Value Date   ALT 33 04/08/2020   AST 23 04/08/2020   PLT 213 04/08/2020   TSH 1.560 04/08/2020     He was last seen for this 6 months ago.  Management since that visit includes no medication changes.  He reports fair compliance with treatment. He is not having side effects. none  Current diet: well balanced Current exercise: walking  The 10-year ASCVD risk score (Arnett DK, et al., 2019) is: 6.7%  --------------------------------------------------------------------------------------------------- Prediabetes, Follow-up  Lab Results  Component Value Date   HGBA1C 6.1 (H) 04/08/2020   HGBA1C 5.9 (H) 03/08/2017   HGBA1C 5.7 (H) 03/03/2016   GLUCOSE 113 (H) 04/08/2020   GLUCOSE 104 (H) 04/04/2019   GLUCOSE 102 (H) 03/28/2018    Last seen for for this6 months ago.  Management since that visit includes no medication changes. Watch diet and increase exercise.  Current symptoms  include none and have been unchanged.  Prior visit with dietician: no Current diet: well balanced Current exercise: walking  Pertinent Labs:    Component Value Date/Time   CHOL 137 04/08/2020 1016   TRIG 243 (H) 04/08/2020 1016   CHOLHDL 5.5 (H) 04/08/2020 1016   CREATININE 0.92 04/08/2020 1016    Wt Readings from Last 3 Encounters:  10/09/20 201 lb (91.2 kg)  08/13/20 199 lb 4 oz (90.4 kg)  06/10/20 200 lb (90.7 kg)    -----------------------------------------------------------------------------------------     Medications: Outpatient Medications Prior to Visit  Medication Sig   aspirin EC 81 MG tablet Take by mouth.   atorvastatin (LIPITOR) 80 MG tablet TAKE 1 TABLET BY MOUTH EVERY DAY   ezetimibe (ZETIA) 10 MG tablet Take 1 tablet (10 mg total) by mouth daily.   fluticasone (FLONASE) 50 MCG/ACT nasal spray SPRAY 1 SPRAY INTO BOTH NOSTRILS DAILY.   mesalamine (LIALDA) 1.2 g EC tablet Take 2 tablets (2.4 g total) by mouth daily with breakfast.   Multiple Vitamin (MULTIVITAMIN) capsule Take by mouth.   pantoprazole (PROTONIX) 40 MG tablet TAKE 1 TABLET BY MOUTH EVERY DAY   Probiotic Product (PROBIOTIC DAILY PO) Take by mouth.   VASCEPA 1 g capsule TAKE 1 CAPSULE (1 G TOTAL) BY MOUTH 2 (TWO) TIMES DAILY.   No facility-administered medications prior to visit.    Review of Systems  Constitutional:  Negative for activity change and fatigue.  Respiratory:  Negative for cough and shortness of  breath.   Cardiovascular:  Negative for chest pain, palpitations and leg swelling.  Endocrine: Negative for cold intolerance, heat intolerance, polydipsia, polyphagia and polyuria.  Musculoskeletal:  Negative for arthralgias and joint swelling.  Neurological:  Negative for dizziness, light-headedness and headaches.      Objective    BP 122/87 (BP Location: Right Arm, Patient Position: Sitting, Cuff Size: Large)   Pulse 79   Temp (!) 97.5 F (36.4 C) (Oral)   Resp 16   Ht 5' 9"   (1.753 m)   Wt 201 lb (91.2 kg)   SpO2 98%   BMI 29.68 kg/m  {Show previous vital signs (optional):23777}  Physical Exam Vitals reviewed.  Constitutional:      Appearance: Normal appearance. He is well-developed.  HENT:     Head: Normocephalic and atraumatic.     Nose: Nose normal.     Mouth/Throat:     Pharynx: Oropharynx is clear.  Eyes:     General: No scleral icterus.    Conjunctiva/sclera: Conjunctivae normal.  Neck:     Thyroid: No thyromegaly.  Cardiovascular:     Rate and Rhythm: Normal rate and regular rhythm.     Heart sounds: Normal heart sounds.  Pulmonary:     Effort: Pulmonary effort is normal.  Abdominal:     Palpations: Abdomen is soft.     Comments: Patient with a small, mildly tender umbilical hernia.  Genitourinary:    Penis: Normal.      Testes: Normal.  Lymphadenopathy:     Cervical: No cervical adenopathy.  Skin:    General: Skin is warm and dry.  Neurological:     Mental Status: He is alert and oriented to person, place, and time.  Psychiatric:        Mood and Affect: Mood normal.        Behavior: Behavior normal.        Thought Content: Thought content normal.        Judgment: Judgment normal.      No results found for any visits on 10/09/20.  Assessment & Plan     1. Hyperlipidemia, unspecified hyperlipidemia type Due to family history of cardiology which is especially labs ordered. - Lipid panel - Lipoprotein A (LPA) - NMR, lipoprofile - High sensitivity CRP  2. Borderline diabetes  - Hemoglobin A1c  3. Right hip pain No sign of inguinal hernia.  Start with x-rays of his hip. - DG HIP UNILAT WITH PELVIS MIN 4 VIEWS RIGHT  4. Umbilical hernia without obstruction and without gangrene Due to discomfort will refer to surgery - Ambulatory referral to General Surgery  5. Skin cancer screening  - Ambulatory referral to Dermatology   Return in about 6 months (around 04/08/2021).      I, Richard Durie, MD, have reviewed  all documentation for this visit. The documentation on 10/12/20 for the exam, diagnosis, procedures, and orders are all accurate and complete.     Richard Skibicki Cran[place] Mon, MD  [facility] [phone] (phone) [phone] (fax)  [place]

## 2020-10-10 LAB — NMR, LIPOPROFILE
Cholesterol, Total: 115 mg/dL (ref 100–199)
HDL Particle Number: 26.8 umol/L — ABNORMAL LOW (ref >=30.5)
HDL-C: 27 mg/dL — ABNORMAL LOW (ref >39)
LDL Particle Number: 883 nmol/L (ref <1000)
LDL Size: 19.7 nm — ABNORMAL LOW (ref >20.5)
LDL-C (NIH Calc): 61 mg/dL (ref 0–99)
LP-IR Score: 73 — ABNORMAL HIGH (ref <=45)
Small LDL Particle Number: 689 nmol/L — ABNORMAL HIGH (ref <=527)
Triglycerides: 156 mg/dL — ABNORMAL HIGH (ref 0–149)

## 2020-10-10 LAB — LIPID PANEL
Chol/HDL Ratio: 4 ratio (ref 0.0–5.0)
Cholesterol, Total: 113 mg/dL (ref 100–199)
HDL: 28 mg/dL — ABNORMAL LOW (ref >39)
LDL Chol Calc (NIH): 58 mg/dL (ref 0–99)
Triglycerides: 157 mg/dL — ABNORMAL HIGH (ref 0–149)
VLDL Cholesterol Cal: 27 mg/dL (ref 5–40)

## 2020-10-10 LAB — HEMOGLOBIN A1C
Est. average glucose Bld gHb Est-mCnc: 137 mg/dL
Hgb A1c MFr Bld: 6.4 % — ABNORMAL HIGH (ref 4.8–5.6)

## 2020-10-10 LAB — HIGH SENSITIVITY CRP: CRP, High Sensitivity: 0.59 mg/L (ref 0.00–3.00)

## 2020-10-10 LAB — LIPOPROTEIN A (LPA): Lipoprotein (a): <8.4 nmol/L (ref <75.0)

## 2020-10-17 ENCOUNTER — Encounter: Payer: Self-pay | Admitting: Surgery

## 2020-10-17 ENCOUNTER — Ambulatory Visit (INDEPENDENT_AMBULATORY_CARE_PROVIDER_SITE_OTHER): Payer: 59 | Admitting: Surgery

## 2020-10-17 ENCOUNTER — Other Ambulatory Visit: Payer: Self-pay

## 2020-10-17 VITALS — BP 137/87 | HR 92 | Temp 98.6°F | Ht 69.0 in | Wt 199.4 lb

## 2020-10-17 DIAGNOSIS — M6208 Separation of muscle (nontraumatic), other site: Secondary | ICD-10-CM | POA: Diagnosis not present

## 2020-10-17 DIAGNOSIS — K429 Umbilical hernia without obstruction or gangrene: Secondary | ICD-10-CM

## 2020-10-17 NOTE — Progress Notes (Signed)
10/17/2020  Reason for Visit:  Umbilical hernia  Referring Provider:  Miguel Aschoff, MD  History of Present Illness: Richard Adkins is a 55 y.o. male presenting for evaluation of an umbilical hernia.  The patient reports that he has had this for a few years, about 4-5.  Initially had not really been giving him any discomfort but he feels that lately he has been growing in size and now has become more sensitive with some discomfort or pain when something brushes or touches it.  Denies any nausea, vomiting, diarrhea, constipation.  Denies any fevers, chills, chest pain, shortness of breath.  The discomfort is mostly at the umbilicus and does not radiate.  He does report that he gets more aggravated with activity.  Of note, he had a cardiac CT on 04/23/2020 which showed a coronary calcium score of 391 which is 94th percentile for age and sex matched control.  He has seen cardiology and has been recommended to continue aspirin and statin in addition to an exercise and diet regimen.  Past Medical History: Past Medical History:  Diagnosis Date   Allergy    Arterioloscleroses 2017   Hyperlipidemia      Past Surgical History: Past Surgical History:  Procedure Laterality Date   COLONOSCOPY WITH PROPOFOL N/A 09/26/2015   Procedure: COLONOSCOPY WITH PROPOFOL;  Surgeon: Lucilla Lame, MD;  Location: [facility];  Service: Endoscopy;  Laterality: N/A;  requests early   COLONOSCOPY WITH PROPOFOL N/A 05/27/2020   Procedure: COLONOSCOPY WITH PROPOFOL;  Surgeon: Lucilla Lame, MD;  Location: [facility] ENDOSCOPY;  Service: Endoscopy;  Laterality: N/A;   HEMORRHOID SURGERY     LASIK     POLYPECTOMY  09/26/2015   Procedure: POLYPECTOMY;  Surgeon: Lucilla Lame, MD;  Location: [facility];  Service: Endoscopy;;    Home Medications: Prior to Admission medications   Medication Sig Start Date End Date Taking? Authorizing Provider  aspirin EC 81 MG tablet Take by mouth.   Yes [provider]   atorvastatin (LIPITOR) 80 MG tablet TAKE 1 TABLET BY MOUTH EVERY DAY 05/14/20  Yes Jerrol Banana., MD  ezetimibe (ZETIA) 10 MG tablet Take 1 tablet (10 mg total) by mouth daily. 08/13/20  Yes Gollan, Kathlene November, MD  fluticasone (FLONASE) 50 MCG/ACT nasal spray SPRAY 1 SPRAY INTO BOTH NOSTRILS DAILY. 06/25/20  Yes Jerrol Banana., MD  mesalamine (LIALDA) 1.2 g EC tablet Take 2 tablets (2.4 g total) by mouth daily with breakfast. 06/10/20  Yes Lucilla Lame, MD  Multiple Vitamin (MULTIVITAMIN) capsule Take by mouth.   Yes [provider]  pantoprazole (PROTONIX) 40 MG tablet TAKE 1 TABLET BY MOUTH EVERY DAY 06/29/20  Yes Jerrol Banana., MD  Probiotic Product (PROBIOTIC DAILY PO) Take by mouth.   Yes [provider]  VASCEPA 1 g capsule TAKE 1 CAPSULE (1 G TOTAL) BY MOUTH 2 (TWO) TIMES DAILY. 03/31/20  Yes Jerrol Banana., MD    Allergies: No Known Allergies  Social History:  reports that he has never smoked. He has never used smokeless tobacco. He reports current alcohol use of about 3.0 standard drinks per week. He reports that he does not use drugs.   Family History: Family History  Problem Relation Age of Onset   Hyperlipidemia Mother    Hypertension Mother    Diabetes Mother    Heart disease Mother    Sarcoidosis Father    Hyperlipidemia Brother    Hyperlipidemia Brother    Heart  attack Brother     Review of Systems: Review of Systems  Constitutional:  Negative for chills and fever.  HENT:  Negative for hearing loss.   Respiratory:  Negative for shortness of breath.   Cardiovascular:  Negative for chest pain.  Gastrointestinal:  Positive for abdominal pain. Negative for constipation, diarrhea, nausea and vomiting.  Genitourinary:  Negative for dysuria.  Musculoskeletal:  Negative for myalgias.  Skin:  Negative for rash.  Neurological:  Negative for dizziness.  Psychiatric/Behavioral:  Negative for depression.    Physical Exam BP  137/87   Pulse 92   Temp 98.6 F (37 C) (Oral)   Ht 5' 9"  (1.753 m)   Wt 199 lb 6.4 oz (90.4 kg)   SpO2 96%   BMI 29.45 kg/m  CONSTITUTIONAL: No acute distress. HEENT:  Normocephalic, atraumatic, extraocular motion intact. NECK: Trachea is midline, and there is no jugular venous distension.  RESPIRATORY:  Lungs are clear, and breath sounds are equal bilaterally. Normal respiratory effort without pathologic use of accessory muscles. CARDIOVASCULAR: Heart is regular without murmurs, gallops, or rubs. GI: The abdomen is soft, overweight, nondistended, with mild discomfort to palpation at the umbilicus itself.  The patient has a 2 cm umbilical hernia defect which is reducible, although with some sensitivity when touching that area.  The patient also has diastases recti of about 5 to 6 cm in the upper abdomen.  MUSCULOSKELETAL:  Normal muscle strength and tone in all four extremities.  No peripheral edema or cyanosis. SKIN: Skin turgor is normal. There are no pathologic skin lesions.  NEUROLOGIC:  Motor and sensation is grossly normal.  Cranial nerves are grossly intact. PSYCH:  Alert and oriented to person, place and time. Affect is normal.  Laboratory Analysis: No results found for this or any previous visit (from the past 24 hour(s)).  Imaging: No results found.  Assessment and Plan: This is a 55 y.o. male with umbilical hernia in the setting of diastases recti.  - Discussed with the patient that he has a reducible umbilical hernia and that now that he is developing symptoms, we can certainly offer him surgical repair.  Discussed with him the potential options for minimally invasive approach which would be a robotic assisted umbilical hernia repair.  Discussed with him also the potential for including plication of the diastases recti with mesh placement to reinforce the repair.  Discussed with him the differences between only doing the umbilical hernia with mesh versus both umbilical  hernia and diastases with mesh including differences between same-day surgery and overnight admission, pain control, abdominal binder, and he has opted for only proceeding with an umbilical hernia repair with mesh. - Discussed with him the surgery at length including the risks of bleeding, infection, injury to surrounding structures, and he is willing to proceed. - The patient is currently thinking of having surgery around November time.  As such, he will follow-up with me towards the end of October t schedule surgery and update H&P.  He understands that prior to surgery, he would need to stop his aspirin for 5 days. - Follow-up towards the end of October.  Return precautions given.  Face-to-face time spent with the patient and care providers was 60 minutes, with more than 50% of the time spent counseling, educating, and coordinating care of the patient.     Melvyn Neth, [facility]

## 2020-10-17 NOTE — Patient Instructions (Addendum)
If you have any concerns or questions, please feel free to call our office. See follow up appointment below.   Umbilical Hernia, Adult A hernia is a bulge of tissue that pushes through an opening between muscles. An umbilical hernia happens in the abdomen, near the belly button (umbilicus). The hernia may contain tissues from the small intestine, large intestine, or fatty tissue covering the intestines (omentum). Umbilical hernias in adults tend to get worse over time, and they require surgical treatment. There are several types of umbilical hernias. You may have: A hernia located just above or below the umbilicus (indirect hernia). This is the most common type of umbilical hernia in adults. A hernia that forms through an opening formed by the umbilicus (direct hernia). A hernia that comes and goes (reducible hernia). A reducible hernia may be visible only when you strain, lift something heavy, or cough. This type of hernia can be pushed back into the abdomen (reduced). A hernia that traps abdominal tissue inside the hernia (incarcerated hernia). This type of hernia cannot be reduced. A hernia that cuts off blood flow to the tissues inside the hernia (strangulated hernia). The tissues can start to die if this happens. This type of hernia requires emergency treatment. What are the causes? An umbilical hernia happens when tissue inside the abdomen presses on a weak area of the abdominal muscles. What increases the risk? You may have a greater risk of this condition if you: Are obese. Have had several pregnancies. Have a buildup of fluid inside your abdomen (ascites). Have had surgery that weakens the abdominal muscles. What are the signs or symptoms? The main symptom of this condition is a painless bulge at or near the belly button. A reducible hernia may be visible only when you strain, lift something heavy, or cough. Other symptoms may include: Dull pain. A feeling of pressure. Symptoms of a  strangulated hernia may include: Pain that gets increasingly worse. Nausea and vomiting. Pain when pressing on the hernia. Skin over the hernia becoming red or purple. Constipation. Blood in the stool. How is this diagnosed? This condition may be diagnosed based on: A physical exam. You may be asked to cough or strain while standing. These actions increase the pressure inside your abdomen and force the hernia through the opening in your muscles. Your health care provider may try to reduce the hernia by pressing on it. Your symptoms and medical history. How is this treated? Surgery is the only treatment for an umbilical hernia. Surgery for a strangulated hernia is done as soon as possible. If you have a small hernia that is not incarcerated, you may need to lose weight before having surgery. Follow these instructions at home: Lose weight, if told by your health care provider. Do not try to push the hernia back in. Watch your hernia for any changes in color or size. Tell your health care provider if any changes occur. You may need to avoid activities that increase pressure on your hernia. Do not lift anything that is heavier than 10 lb (4.5 kg) until your health care provider says that this is safe. Take over-the-counter and prescription medicines only as told by your health care provider. Keep all follow-up visits as told by your health care provider. This is important. Contact a health care provider if: Your hernia gets larger. Your hernia becomes painful. Get help right away if: You develop sudden, severe pain near the area of your hernia. You have pain as well as nausea or vomiting.  You have pain and the skin over your hernia changes color. You develop a fever. This information is not intended to replace advice given to you by your health care provider. Make sure you discuss any questions you have with your health care provider.  Diastasis Recti Diastasis recti is a condition in  which the muscles of the abdomen (rectus abdominis muscles) become thin and separate. The result is a wider space between the muscles of the right and left abdomen (abdominal muscles). This wider space between the muscles may cause a bulge in the middle of the abdomen. This bulge may be noticed when a person is straining or when he or she sits up after lying down. Diastasis recti can affect men and women. It is most common among pregnant women, babies, people with obesity, and people who have had abdominal surgery. Exercise or surgery may help correct this condition. What are the causes? Common causes of this condition include: Pregnancy. As the uterus grows in size, it puts pressure on the abdominal muscles, causing the muscles to separate. Obesity. Excess fat puts pressure on abdominal muscles. Weight lifting. Some exercises of the abdomen. Advanced age. Genetics. Having had surgery on the abdomen before. What increases the risk? This condition is more likely to develop in: Women. Newborns, especially newborns who are born early (prematurely). What are the signs or symptoms? Common symptoms of this condition include: A bulge in the middle of your abdomen. You will notice it most when you sit up or strain. Pain in your low back, hips, or the area between your hip bones (pelvis). Constipation. Being unable to control when you urinate (urinary incontinence). Bloating. Poor posture. How is this diagnosed? This condition is diagnosed with a physical exam. During the exam, your health care provider will ask you to lie flat on your back and do a crunch or half sit-up. If you have diastasis recti, a bulge will appear lengthwise between your abdominal muscles in the center of your abdomen. Your health care provider will measure the gap between your muscles with one of the following: A medical device used to measure the space between two objects (caliper). A tape measure. CT  scan. Ultrasound. Finger spaces. Your health care provider will measure the space using his or her fingers. How is this treated? If your muscle separation is not too large, you may not need treatment. However, if you are a woman who plans to become pregnant again, you should treat this condition before your next pregnancy. Treatment may include: Physical therapy exercises to strengthen and tighten your abdominal muscles. Lifestyle changes such as weight loss and exercise. Over-the-counter pain medicines as needed. Surgery to correct the separation. Follow these instructions at home: Activity Return to your normal activities as told by your health care provider. Ask your health care provider what activities are safe for you. Do exercises as told by your health care provider. Make sure you are doing your exercises and movements correctly when lifting weights or doing exercises using your abdominal muscles or the muscles in the center of your body that give stability (core muscles). Proper form can help to prevent this condition from happening again. General instructions If you are overweight, ask your health care provider for help with weight loss. Losing even a small amount of weight can help to improve your diastasis recti. Take over-the-counter or prescription medicines only as told by your health care provider. Do not strain. Straining can make the separation worse. Examples of straining include: Pushing  hard to have a bowel movement, such as when you have constipation. Lifting heavy objects or lifting children. Standing up and sitting down. You may need to take these actions to prevent or treat constipation: Drink enough fluid to keep your urine pale yellow. Take over-the-counter or prescription medicines. Eat foods that are high in fiber, such as beans, whole grains, and fresh fruits and vegetables. Limit foods that are high in fat and processed sugars, such as fried or sweet foods. Keep  all follow-up visits. This is important. Contact a health care provider if: You notice a new bulge in your abdomen. Get help right away if: You experience severe discomfort in your abdomen. You develop severe abdominal pain along with nausea, vomiting, or a fever. Summary Diastasis recti is a condition in which the muscles of the abdomen (rectus abdominismuscles) become thin and separate. You may notice a bulge in your abdomen because the space has widened between the muscles of the right and left abdomen. The most common symptom is a bulge in the middle of your abdomen. You will notice it most when you sit up or strain. This condition is diagnosed with a physical exam. If the muscle separation is not too big, you may not need treatment. Otherwise, you may need to do physical therapy or have surgery. This information is not intended to replace advice given to you by your health care provider. Make sure you discuss any questions you have with your health care provider. Document Revised: 09/21/2019 Document Reviewed: 09/21/2019 Elsevier Patient Education  [place].

## 2020-10-26 ENCOUNTER — Other Ambulatory Visit: Payer: Self-pay | Admitting: Family Medicine

## 2020-11-21 ENCOUNTER — Ambulatory Visit: Payer: 59 | Admitting: Surgery

## 2021-02-03 ENCOUNTER — Other Ambulatory Visit: Payer: Self-pay | Admitting: Family Medicine

## 2021-02-03 DIAGNOSIS — E785 Hyperlipidemia, unspecified: Secondary | ICD-10-CM

## 2021-02-03 NOTE — Telephone Encounter (Signed)
[facility] faxed refill request for the following medications:  Icosapent Ethyl (VASCEPA) 1 g CAPS   Please advise.

## 2021-02-05 MED ORDER — ICOSAPENT ETHYL 1 G PO CAPS: ORAL_CAPSULE | ORAL | 3 refills | Status: DC

## 2021-02-20 ENCOUNTER — Other Ambulatory Visit: Payer: Self-pay

## 2021-02-20 ENCOUNTER — Encounter: Payer: Self-pay | Admitting: Surgery

## 2021-02-20 ENCOUNTER — Ambulatory Visit (INDEPENDENT_AMBULATORY_CARE_PROVIDER_SITE_OTHER): Payer: 59 | Admitting: Surgery

## 2021-02-20 VITALS — BP 132/87 | HR 85 | Temp 98.2°F | Ht 69.0 in | Wt 199.0 lb

## 2021-02-20 DIAGNOSIS — K429 Umbilical hernia without obstruction or gangrene: Secondary | ICD-10-CM | POA: Diagnosis not present

## 2021-02-20 NOTE — Patient Instructions (Signed)
Our surgery scheduler Pamala Hurry will call you within 24-48 hours to get you scheduled. If you have not heard from her after 48 hours, please call our office. You will not need to get Covid tested before surgery and have the blue sheet available when she calls to write down important information.   If you have any concerns or questions, please feel free to call our office.  Umbilical Hernia, Adult A hernia is a bulge of tissue that pushes through an opening between muscles. An umbilical hernia happens in the abdomen, near the belly button (umbilicus). The hernia may contain tissues from the small intestine, large intestine, or fatty tissue covering the intestines. Umbilical hernias in adults tend to get worse over time, and they require surgical treatment. There are different types of umbilical hernias, including: Indirect hernia. This type is located just above or below the umbilicus. It is the most common type of umbilical hernia in adults. Direct hernia. This type forms through an opening formed by the umbilicus. Reducible hernia. This type of hernia comes and goes. It may be visible only when you strain, lift something heavy, or cough. This type of hernia can be pushed back into the abdomen (reduced). Incarcerated hernia. This type traps abdominal tissue inside the hernia. This type of hernia cannot be reduced. Strangulated hernia. This type of hernia cuts off blood flow to the tissues inside the hernia. The tissues can start to die if this happens. This type of hernia requires emergency treatment. What are the causes? An umbilical hernia happens when tissue inside the abdomen presses on a weak area of the abdominal muscles. What increases the risk? You may have a greater risk of this condition if you: Are obese. Have had several pregnancies. Have a buildup of fluid inside your abdomen. Have had surgery that weakens the abdominal muscles. What are the signs or symptoms? The main symptom of  this condition is a painless bulge at or near the belly button. A reducible hernia may be visible only when you strain, lift something heavy, or cough. Other symptoms may include: Dull pain. A feeling of pressure. Symptoms of a strangulated hernia may include: Pain that gets increasingly worse. Nausea and vomiting. Pain when pressing on the hernia. Skin over the hernia becoming red or purple. Constipation. Blood in the stool. How is this diagnosed? This condition may be diagnosed based on: A physical exam. You may be asked to cough or strain while standing. These actions increase the pressure inside your abdomen and can force the hernia through the opening in your muscles. Your health care provider may try to reduce the hernia by pressing on it. Your symptoms and medical history. How is this treated? Surgery is the only treatment for an umbilical hernia. Surgery for a strangulated hernia is done as soon as possible. If you have a small hernia that is not incarcerated, you may need to lose weight before having surgery. Follow these instructions at home: Lose weight, if told by your health care provider. Do not try to push the hernia back in. Watch your hernia for any changes in color or size. Tell your health care provider if any changes occur. You may need to avoid activities that increase pressure on your hernia. Do not lift anything that is heavier than 10 lb (4.5 kg), or the limit that you are told, until your health care provider says that it is safe. Take over-the-counter and prescription medicines only as told by your health care provider. Keep all  follow-up visits. This is important. Contact a health care provider if: Your hernia gets larger. Your hernia becomes painful. Get help right away if: You develop sudden, severe pain near the area of your hernia. You have pain as well as nausea or vomiting. You have pain and the skin over your hernia changes color. You develop a fever  or chills. Summary A hernia is a bulge of tissue that pushes through an opening between muscles. An umbilical hernia happens near the belly button. Surgery is the only treatment for an umbilical hernia. Do not try to push your hernia back in. Keep all follow-up visits. This is important. This information is not intended to replace advice given to you by your health care provider. Make sure you discuss any questions you have with your health care provider. Document Revised: 08/27/2019 Document Reviewed: 08/27/2019 Elsevier Patient Education  [place].

## 2021-02-20 NOTE — H&P (View-Only) (Signed)
02/20/2021  History of Present Illness: Richard Adkins is a 56 y.o. male presenting for follow up of an umbilical hernia.  He was last seen on 10/17/20 for initial visit.  He was found to have an approximately 2 cm hernia defect at the umbilicus, with a component of diastasis recti.  The patient preferred at the time to repair the umbilical hernia without plication of the diastasis.  He wanted to wait for the surgery and is now follow up with [place] to schedule.  Patient denies any worsening symptoms or changes with the hernia.  Reports that it is still sensitive when touching it or when it brushes against something.  Denies any firmness or hardness of the bulging contents, and denies any nausea, vomiting.    Past Medical History: Past Medical History:  Diagnosis Date   Allergy    Arterioloscleroses 2017   Hyperlipidemia      Past Surgical History: Past Surgical History:  Procedure Laterality Date   COLONOSCOPY WITH PROPOFOL N/A 09/26/2015   Procedure: COLONOSCOPY WITH PROPOFOL;  Surgeon: Lucilla Lame, MD;  Location: [place];  Service: Endoscopy;  Laterality: N/A;  requests early   COLONOSCOPY WITH PROPOFOL N/A 05/27/2020   Procedure: COLONOSCOPY WITH PROPOFOL;  Surgeon: Lucilla Lame, MD;  Location: [facility] ENDOSCOPY;  Service: Endoscopy;  Laterality: N/A;   HEMORRHOID SURGERY     LASIK     POLYPECTOMY  09/26/2015   Procedure: POLYPECTOMY;  Surgeon: Lucilla Lame, MD;  Location: [place];  Service: Endoscopy;;    Home Medications: Prior to Admission medications   Medication Sig Start Date End Date Taking? Authorizing Provider  aspirin EC 81 MG tablet Take by mouth.   Yes [provider]  atorvastatin (LIPITOR) 80 MG tablet TAKE 1 TABLET BY MOUTH EVERY DAY 05/14/20  Yes Jerrol Banana., MD  ezetimibe (ZETIA) 10 MG tablet Take 1 tablet (10 mg total) by mouth daily. 08/13/20  Yes Gollan, Kathlene November, MD  fluticasone (FLONASE) 50 MCG/ACT nasal spray SPRAY 1 SPRAY INTO  BOTH NOSTRILS DAILY. 06/25/20  Yes Jerrol Banana., MD  icosapent Ethyl (VASCEPA) 1 g capsule TAKE 1 CAPSULE (1 G TOTAL) BY MOUTH 2 (TWO) TIMES DAILY. 02/05/21  Yes Myles Gip, DO  mesalamine (LIALDA) 1.2 g EC tablet Take 2 tablets (2.4 g total) by mouth daily with breakfast. 06/10/20  Yes Lucilla Lame, MD  Multiple Vitamin (MULTIVITAMIN) capsule Take by mouth.   Yes [provider]  pantoprazole (PROTONIX) 40 MG tablet TAKE 1 TABLET BY MOUTH EVERY DAY 10/27/20  Yes Jerrol Banana., MD  Probiotic Product (PROBIOTIC DAILY PO) Take by mouth.   Yes [provider]    Allergies: No Known Allergies  Review of Systems: Review of Systems  Constitutional:  Negative for chills and fever.  HENT:  Negative for hearing loss.   Respiratory:  Negative for shortness of breath.   Cardiovascular:  Negative for chest pain.  Gastrointestinal:  Positive for abdominal pain (mild at umbilicus). Negative for nausea and vomiting.  Genitourinary:  Negative for dysuria.  Musculoskeletal:  Negative for myalgias.  Skin:  Negative for rash.  Neurological:  Negative for dizziness.  Psychiatric/Behavioral:  Negative for depression.    Physical Exam BP 132/87    Pulse 85    Temp 98.2 F (36.8 C) (Oral)    Ht 5' 9"  (1.753 m)    Wt 199 lb (90.3 kg)    SpO2 93%    BMI 29.39 kg/m  CONSTITUTIONAL: No acute distress, well nourished. HEENT:  Normocephalic, atraumatic, extraocular motion intact. NECK:  Trachea is midline, no jugular venous distention. RESPIRATORY:  Lungs are clear, and breath sounds are equal bilaterally. Normal respiratory effort without pathologic use of accessory muscles. CARDIOVASCULAR: Heart is regular without murmurs, gallops, or rubs. GI: The abdomen is soft, non-distended, with some discomfort to palpation at the umbilicus.  His hernia is reducible, about 2 cm in size.  No overlying skin changes or ulceration.  Has stable diastasis recti of upper  abdomen. MUSCULOSKELETAL:  Normal gait, no peripheral edema. NEUROLOGIC:  Motor and sensation is grossly normal.  Cranial nerves are grossly intact. PSYCH:  Alert and oriented to person, place and time. Affect is normal.  Labs/Imaging: None new.  Assessment and Plan: This is a 56 y.o. male with symptomatic reducible umbilical hernia.  --Discussed with the patient the plan for a robotic assisted umbilical hernia repair.  As part of the process to repair the hernia, would also be plicating the very inferior portion of his diastasis to strengthen the repair but would not plicate the full length per his request.  Would also use mesh in the repair.  Reviewed with him the surgery at length including the risks of bleeding, infection, injury to surrounding structures, that this would be an outpatient surgery, post-op activity restrictions, pain control, and he's willing to proceed. --Will schedule him for 03/10/21.  He would stop his Aspirin prior to surgery, with last dose on 03/04/21.  All questions answered.  I spent 40 minutes dedicated to the care of this patient on the date of this encounter to include pre-visit review of records, face-to-face time with the patient discussing diagnosis and management, and any post-visit coordination of care.   Melvyn Neth, [facility]

## 2021-02-20 NOTE — Progress Notes (Signed)
02/20/2021  History of Present Illness: Richard Adkins is a 56 y.o. male presenting for follow up of an umbilical hernia.  He was last seen on 10/17/20 for initial visit.  He was found to have an approximately 2 cm hernia defect at the umbilicus, with a component of diastasis recti.  The patient preferred at the time to repair the umbilical hernia without plication of the diastasis.  He wanted to wait for the surgery and is now follow up with [place] to schedule.  Patient denies any worsening symptoms or changes with the hernia.  Reports that it is still sensitive when touching it or when it brushes against something.  Denies any firmness or hardness of the bulging contents, and denies any nausea, vomiting.    Past Medical History: Past Medical History:  Diagnosis Date   Allergy    Arterioloscleroses 2017   Hyperlipidemia      Past Surgical History: Past Surgical History:  Procedure Laterality Date   COLONOSCOPY WITH PROPOFOL N/A 09/26/2015   Procedure: COLONOSCOPY WITH PROPOFOL;  Surgeon: Lucilla Lame, MD;  Location: [place];  Service: Endoscopy;  Laterality: N/A;  requests early   COLONOSCOPY WITH PROPOFOL N/A 05/27/2020   Procedure: COLONOSCOPY WITH PROPOFOL;  Surgeon: Lucilla Lame, MD;  Location: [facility] ENDOSCOPY;  Service: Endoscopy;  Laterality: N/A;   HEMORRHOID SURGERY     LASIK     POLYPECTOMY  09/26/2015   Procedure: POLYPECTOMY;  Surgeon: Lucilla Lame, MD;  Location: [place];  Service: Endoscopy;;    Home Medications: Prior to Admission medications   Medication Sig Start Date End Date Taking? Authorizing Provider  aspirin EC 81 MG tablet Take by mouth.   Yes [provider]  atorvastatin (LIPITOR) 80 MG tablet TAKE 1 TABLET BY MOUTH EVERY DAY 05/14/20  Yes Jerrol Banana., MD  ezetimibe (ZETIA) 10 MG tablet Take 1 tablet (10 mg total) by mouth daily. 08/13/20  Yes Gollan, Kathlene November, MD  fluticasone (FLONASE) 50 MCG/ACT nasal spray SPRAY 1 SPRAY INTO  BOTH NOSTRILS DAILY. 06/25/20  Yes Jerrol Banana., MD  icosapent Ethyl (VASCEPA) 1 g capsule TAKE 1 CAPSULE (1 G TOTAL) BY MOUTH 2 (TWO) TIMES DAILY. 02/05/21  Yes Myles Gip, DO  mesalamine (LIALDA) 1.2 g EC tablet Take 2 tablets (2.4 g total) by mouth daily with breakfast. 06/10/20  Yes Lucilla Lame, MD  Multiple Vitamin (MULTIVITAMIN) capsule Take by mouth.   Yes [provider]  pantoprazole (PROTONIX) 40 MG tablet TAKE 1 TABLET BY MOUTH EVERY DAY 10/27/20  Yes Jerrol Banana., MD  Probiotic Product (PROBIOTIC DAILY PO) Take by mouth.   Yes [provider]    Allergies: No Known Allergies  Review of Systems: Review of Systems  Constitutional:  Negative for chills and fever.  HENT:  Negative for hearing loss.   Respiratory:  Negative for shortness of breath.   Cardiovascular:  Negative for chest pain.  Gastrointestinal:  Positive for abdominal pain (mild at umbilicus). Negative for nausea and vomiting.  Genitourinary:  Negative for dysuria.  Musculoskeletal:  Negative for myalgias.  Skin:  Negative for rash.  Neurological:  Negative for dizziness.  Psychiatric/Behavioral:  Negative for depression.    Physical Exam BP 132/87    Pulse 85    Temp 98.2 F (36.8 C) (Oral)    Ht 5' 9"  (1.753 m)    Wt 199 lb (90.3 kg)    SpO2 93%    BMI 29.39 kg/m  CONSTITUTIONAL: No acute distress, well nourished. HEENT:  Normocephalic, atraumatic, extraocular motion intact. NECK:  Trachea is midline, no jugular venous distention. RESPIRATORY:  Lungs are clear, and breath sounds are equal bilaterally. Normal respiratory effort without pathologic use of accessory muscles. CARDIOVASCULAR: Heart is regular without murmurs, gallops, or rubs. GI: The abdomen is soft, non-distended, with some discomfort to palpation at the umbilicus.  His hernia is reducible, about 2 cm in size.  No overlying skin changes or ulceration.  Has stable diastasis recti of upper  abdomen. MUSCULOSKELETAL:  Normal gait, no peripheral edema. NEUROLOGIC:  Motor and sensation is grossly normal.  Cranial nerves are grossly intact. PSYCH:  Alert and oriented to person, place and time. Affect is normal.  Labs/Imaging: None new.  Assessment and Plan: This is a 56 y.o. male with symptomatic reducible umbilical hernia.  --Discussed with the patient the plan for a robotic assisted umbilical hernia repair.  As part of the process to repair the hernia, would also be plicating the very inferior portion of his diastasis to strengthen the repair but would not plicate the full length per his request.  Would also use mesh in the repair.  Reviewed with him the surgery at length including the risks of bleeding, infection, injury to surrounding structures, that this would be an outpatient surgery, post-op activity restrictions, pain control, and he's willing to proceed. --Will schedule him for 03/10/21.  He would stop his Aspirin prior to surgery, with last dose on 03/04/21.  All questions answered.  I spent 40 minutes dedicated to the care of this patient on the date of this encounter to include pre-visit review of records, face-to-face time with the patient discussing diagnosis and management, and any post-visit coordination of care.   Melvyn Neth, [facility]

## 2021-02-23 ENCOUNTER — Telehealth: Payer: Self-pay | Admitting: Surgery

## 2021-02-23 NOTE — Telephone Encounter (Signed)
Patient has been advised of Pre-Admission date/time, COVID Testing date and Surgery date.  Surgery Date: 03/10/21 Preadmission Testing Date: 03/02/21 (phone 1p-5p) Covid Testing Date: Not  needed.     Patient has been made aware to call [phone], between 1-3:00pm the day before surgery, to find out what time to arrive for surgery.

## 2021-03-02 ENCOUNTER — Encounter
Admission: RE | Admit: 2021-03-02 | Discharge: 2021-03-02 | Disposition: A | Discharge status: Home / Self Care | Payer: 59 | Source: Ambulatory Visit | Attending: Surgery | Admitting: Surgery

## 2021-03-02 ENCOUNTER — Other Ambulatory Visit: Payer: Self-pay

## 2021-03-02 DIAGNOSIS — I2510 Atherosclerotic heart disease of native coronary artery without angina pectoris: Secondary | ICD-10-CM

## 2021-03-02 HISTORY — DX: Noninfective gastroenteritis and colitis, unspecified: K52.9

## 2021-03-02 HISTORY — DX: Gastro-esophageal reflux disease without esophagitis: K21.9

## 2021-03-02 HISTORY — DX: Personal history of urinary calculi: Z87.442

## 2021-03-02 HISTORY — DX: Prediabetes: R73.03

## 2021-03-02 NOTE — Patient Instructions (Addendum)
Your procedure is scheduled on:03-10-21 Tuesday Report to the Registration Desk on the 1st floor of the [facility].Then proceed to the 2nd floor Surgery Desk in the [facility] To find out your arrival time, please call [phone] between 1PM - 3PM on:03-09-21 Monday  REMEMBER: Instructions that are not followed completely may result in serious medical risk, up to and including death; or upon the discretion of your surgeon and anesthesiologist your surgery may need to be rescheduled.  Do not eat food after midnight the night before surgery.  No gum chewing, lozengers or hard candies.  You may however, drink CLEAR liquids up to 2 hours before you are scheduled to arrive for your surgery. Do not drink anything within 2 hours of your scheduled arrival time.  Clear liquids include: - water  - apple juice without pulp - gatorade (not RED, PURPLE, OR BLUE) - black coffee or tea (Do NOT add milk or creamers to the coffee or tea) Do NOT drink anything that is not on this list.  TAKE THESE MEDICATIONS THE MORNING OF SURGERY WITH A SIP OF WATER: -pantoprazole (PROTONIX)  Stop your aspirin EC 81 MG tablet 5 days prior to surgery as instructed by Dr Darrick Penna dose on 03-04-21 Wednesday  One week prior to surgery: Stop Anti-inflammatories (NSAIDS) such as Advil, Aleve, Ibuprofen, Motrin, Naproxen, Naprosyn and Aspirin based products such as Excedrin, Goodys Powder, BC Powder.You may however, take Tylenol if needed for pain up until the day of surgery.  Stop ANY OVER THE COUNTER supplements/vitamins NOW (03-02-21) until after surgery (MULTIVITAMIN, PROBIOTIC)   No Alcohol for 24 hours before or after surgery.  No Smoking including e-cigarettes for 24 hours prior to surgery.  No chewable tobacco products for at least 6 hours prior to surgery.  No nicotine patches on the day of surgery.  Do not use any "recreational" drugs for at least a week prior to your surgery.  Please be advised that the  combination of cocaine and anesthesia may have negative outcomes, up to and including death. If you test positive for cocaine, your surgery will be cancelled.  On the morning of surgery brush your teeth with toothpaste and water, you may rinse your mouth with mouthwash if you wish. Do not swallow any toothpaste or mouthwash.  Use CHG Soap as directed on instruction sheet.  Do not wear jewelry, make-up, hairpins, clips or nail polish.  Do not wear lotions, powders, or perfumes.   Do not shave body from the neck down 48 hours prior to surgery just in case you cut yourself which could leave a site for infection.  Also, freshly shaved skin may become irritated if using the CHG soap.  Contact lenses, hearing aids and dentures may not be worn into surgery.  Do not bring valuables to the hospital. [facility] is not responsible for any missing/lost belongings or valuables.   Notify your doctor if there is any change in your medical condition (cold, fever, infection).  Wear comfortable clothing (specific to your surgery type) to the hospital.  After surgery, you can help prevent lung complications by doing breathing exercises.  Take deep breaths and cough every 1-2 hours. Your doctor may order a device called an Incentive Spirometer to help you take deep breaths. When coughing or sneezing, hold a pillow firmly against your incision with both hands. This is called splinting. Doing this helps protect your incision. It also decreases belly discomfort.  If you are being admitted to the hospital overnight, leave  your suitcase in the car. After surgery it may be brought to your room.  If you are being discharged the day of surgery, you will not be allowed to drive home. You will need a responsible adult (18 years or older) to drive you home and stay with you that night.   If you are taking public transportation, you will need to have a responsible adult (18 years or older) with you. Please  confirm with your physician that it is acceptable to use public transportation.   Please call the [place] [facility] Dept. at [phone] if you have any questions about these instructions.  Surgery Visitation Policy:  Patients undergoing a surgery or procedure may have one family member or support person with them as long as that person is not COVID-19 positive or experiencing its symptoms.  That person may remain in the waiting area during the procedure and may rotate out with other people.  Inpatient Visitation:    Visiting hours are 7 a.m. to 8 p.m. Up to two visitors ages 16+ are allowed at one time in a patient room. The visitors may rotate out with other people during the day. Visitors must check out when they leave, or other visitors will not be allowed. One designated support person may remain overnight. The visitor must pass COVID-19 screenings, use hand sanitizer when entering and exiting the patients room and wear a mask at all times, including in the patients room. Patients must also wear a mask when staff or their visitor are in the room. Masking is required regardless of vaccination status.

## 2021-03-04 ENCOUNTER — Telehealth: Payer: Self-pay | Admitting: Family Medicine

## 2021-03-04 NOTE — Telephone Encounter (Addendum)
Called patient to inform him "Jacqulyn Bath" from [facility] stated patient picked up a old script for icosapent Ethyl (VASCEPA) 1 g capsule which did not have any refills, pharmacy stated when patient comes back next month it will active 02/05/2021 script with 3 refills and if in stock all 3 refills will be given

## 2021-03-06 ENCOUNTER — Encounter
Admission: RE | Admit: 2021-03-06 | Discharge: 2021-03-06 | Disposition: A | Discharge status: Home / Self Care | Payer: 59 | Source: Ambulatory Visit | Attending: Surgery | Admitting: Surgery

## 2021-03-06 ENCOUNTER — Other Ambulatory Visit: Payer: Self-pay

## 2021-03-06 DIAGNOSIS — Z01812 Encounter for preprocedural laboratory examination: Secondary | ICD-10-CM | POA: Insufficient documentation

## 2021-03-06 DIAGNOSIS — I2510 Atherosclerotic heart disease of native coronary artery without angina pectoris: Secondary | ICD-10-CM | POA: Diagnosis not present

## 2021-03-06 LAB — CBC
HCT: 46.3 % (ref 39.0–52.0)
Hemoglobin: 15.3 g/dL (ref 13.0–17.0)
MCH: 29.4 pg (ref 26.0–34.0)
MCHC: 33 g/dL (ref 30.0–36.0)
MCV: 88.9 fL (ref 80.0–100.0)
Platelets: 249 10*3/uL (ref 150–400)
RBC: 5.21 MIL/uL (ref 4.22–5.81)
RDW: 12.8 % (ref 11.5–15.5)
WBC: 6.6 10*3/uL (ref 4.0–10.5)
nRBC: 0 % (ref 0.0–0.2)

## 2021-03-06 LAB — BASIC METABOLIC PANEL
Anion gap: 10 (ref 5–15)
BUN: 16 mg/dL (ref 6–20)
CO2: 26 mmol/L (ref 22–32)
Calcium: 9.1 mg/dL (ref 8.9–10.3)
Chloride: 101 mmol/L (ref 98–111)
Creatinine, Ser: 0.87 mg/dL (ref 0.61–1.24)
GFR, Estimated: >60 mL/min (ref >60)
Glucose, Bld: 114 mg/dL — ABNORMAL HIGH (ref 70–99)
Potassium: 3.8 mmol/L (ref 3.5–5.1)
Sodium: 137 mmol/L (ref 135–145)

## 2021-03-09 ENCOUNTER — Telehealth: Payer: Self-pay | Admitting: Surgery

## 2021-03-09 NOTE — Telephone Encounter (Signed)
Spoke with patient-he is scheduled for Umbilical Hernia surgery 03/10/2021-he would like a return to work note 03/18/2021. He states he will not be required to lift . I let him know he could not lift for 6 weeks from date of surgery anything greater than 15-20 pounds until 04/21/2021. Patient to print letter from [facility].

## 2021-03-09 NOTE — Telephone Encounter (Signed)
Patient is asking for a doctors note to return to work on February 15th, patient is having surgery tomorrow with Dr. Hampton Abbot patient is asking for this to be uploaded on to his Deloris Ping he works for a company called [employer]  Please call patient and advise if any questions

## 2021-03-10 ENCOUNTER — Other Ambulatory Visit: Payer: Self-pay

## 2021-03-10 ENCOUNTER — Encounter
Admission: RE | Disposition: A | Discharge status: Home / Self Care | Payer: Self-pay | Source: Ambulatory Visit | Attending: Surgery

## 2021-03-10 ENCOUNTER — Ambulatory Visit: Payer: 59 | Admitting: Anesthesiology

## 2021-03-10 ENCOUNTER — Encounter: Payer: Self-pay | Admitting: Surgery

## 2021-03-10 ENCOUNTER — Ambulatory Visit
Admission: RE | Admit: 2021-03-10 | Discharge: 2021-03-10 | Disposition: A | Discharge status: Home / Self Care | Payer: 59 | Source: Ambulatory Visit | Attending: Surgery | Admitting: Surgery

## 2021-03-10 ENCOUNTER — Ambulatory Visit: Payer: 59 | Admitting: Urgent Care

## 2021-03-10 DIAGNOSIS — K219 Gastro-esophageal reflux disease without esophagitis: Secondary | ICD-10-CM | POA: Diagnosis not present

## 2021-03-10 DIAGNOSIS — K429 Umbilical hernia without obstruction or gangrene: Secondary | ICD-10-CM | POA: Diagnosis not present

## 2021-03-10 SURGERY — REPAIR, HERNIA, UMBILICAL, ROBOT-ASSISTED
Anesthesia: General | Site: Abdomen

## 2021-03-10 MED ORDER — LACTATED RINGERS IV SOLN: INTRAVENOUS | Status: DC

## 2021-03-10 MED ORDER — BUPIVACAINE LIPOSOME 1.3 % IJ SUSP: 20.0000 mL | Freq: Once | INTRAMUSCULAR | Status: DC

## 2021-03-10 MED ORDER — HYDROMORPHONE HCL 1 MG/ML IJ SOLN
INTRAMUSCULAR | Status: AC
  Filled 2021-03-10: qty 1

## 2021-03-10 MED ORDER — KETOROLAC TROMETHAMINE 30 MG/ML IJ SOLN
INTRAMUSCULAR | Status: DC | PRN
  Administered 2021-03-10: 15 mg via INTRAVENOUS

## 2021-03-10 MED ORDER — ACETAMINOPHEN 500 MG PO TABS
ORAL_TABLET | ORAL | Status: AC
  Administered 2021-03-10: 1000 mg via ORAL
  Filled 2021-03-10: qty 2

## 2021-03-10 MED ORDER — ORAL CARE MOUTH RINSE: 15.0000 mL | Freq: Once | OROMUCOSAL | Status: AC

## 2021-03-10 MED ORDER — MIDAZOLAM HCL 2 MG/2ML IJ SOLN
INTRAMUSCULAR | Status: AC
  Filled 2021-03-10: qty 2

## 2021-03-10 MED ORDER — BUPIVACAINE LIPOSOME 1.3 % IJ SUSP
INTRAMUSCULAR | Status: AC
  Filled 2021-03-10: qty 20

## 2021-03-10 MED ORDER — GABAPENTIN 300 MG PO CAPS: 300.0000 mg | ORAL_CAPSULE | ORAL | Status: AC

## 2021-03-10 MED ORDER — ONDANSETRON HCL 4 MG/2ML IJ SOLN
INTRAMUSCULAR | Status: DC | PRN
Start: 2021-03-10 — End: 2021-03-10
  Administered 2021-03-10: 4 mg via INTRAVENOUS

## 2021-03-10 MED ORDER — DEXAMETHASONE SODIUM PHOSPHATE 10 MG/ML IJ SOLN
INTRAMUSCULAR | Status: DC | PRN
Start: 2021-03-10 — End: 2021-03-10
  Administered 2021-03-10: 10 mg via INTRAVENOUS

## 2021-03-10 MED ORDER — OXYCODONE HCL 5 MG PO TABS: 5.0000 mg | ORAL_TABLET | Freq: Four times a day (QID) | ORAL | 0 refills | Status: DC | PRN

## 2021-03-10 MED ORDER — CHLORHEXIDINE GLUCONATE CLOTH 2 % EX PADS: 6.0000 | MEDICATED_PAD | Freq: Once | CUTANEOUS | Status: DC

## 2021-03-10 MED ORDER — OXYCODONE HCL 5 MG PO TABS: 5.0000 mg | ORAL_TABLET | ORAL | 0 refills | Status: DC | PRN

## 2021-03-10 MED ORDER — ACETAMINOPHEN 10 MG/ML IV SOLN: 1000.0000 mg | Freq: Once | INTRAVENOUS | Status: DC | PRN

## 2021-03-10 MED ORDER — HYDROMORPHONE HCL 1 MG/ML IJ SOLN
INTRAMUSCULAR | Status: DC | PRN
Start: 2021-03-10 — End: 2021-03-10
  Administered 2021-03-10: 1 mg via INTRAVENOUS

## 2021-03-10 MED ORDER — SUCCINYLCHOLINE CHLORIDE 200 MG/10ML IV SOSY
PREFILLED_SYRINGE | INTRAVENOUS | Status: DC | PRN
  Administered 2021-03-10: 120 mg via INTRAVENOUS

## 2021-03-10 MED ORDER — DEXMEDETOMIDINE HCL IN NACL 200 MCG/50ML IV SOLN
INTRAVENOUS | Status: DC | PRN
  Administered 2021-03-10: 8 ug via INTRAVENOUS
  Administered 2021-03-10: 12 ug via INTRAVENOUS

## 2021-03-10 MED ORDER — CEFAZOLIN SODIUM-DEXTROSE 2-4 GM/100ML-% IV SOLN
INTRAVENOUS | Status: AC
  Filled 2021-03-10: qty 100

## 2021-03-10 MED ORDER — ONDANSETRON HCL 4 MG/2ML IJ SOLN: 4.0000 mg | Freq: Once | INTRAMUSCULAR | Status: DC | PRN

## 2021-03-10 MED ORDER — SODIUM CHLORIDE 0.9 % IR SOLN
Status: DC | PRN
  Administered 2021-03-10: 20 mL

## 2021-03-10 MED ORDER — CHLORHEXIDINE GLUCONATE 0.12 % MT SOLN: 15.0000 mL | Freq: Once | OROMUCOSAL | Status: AC

## 2021-03-10 MED ORDER — GABAPENTIN 300 MG PO CAPS
ORAL_CAPSULE | ORAL | Status: AC
  Administered 2021-03-10: 300 mg via ORAL
  Filled 2021-03-10: qty 1

## 2021-03-10 MED ORDER — CHLORHEXIDINE GLUCONATE 0.12 % MT SOLN
OROMUCOSAL | Status: AC
  Administered 2021-03-10: 15 mL via OROMUCOSAL
  Filled 2021-03-10: qty 15

## 2021-03-10 MED ORDER — PROPOFOL 10 MG/ML IV BOLUS
INTRAVENOUS | Status: DC | PRN
  Administered 2021-03-10: 200 mg via INTRAVENOUS

## 2021-03-10 MED ORDER — FENTANYL CITRATE (PF) 100 MCG/2ML IJ SOLN: 25.0000 ug | INTRAMUSCULAR | Status: DC | PRN

## 2021-03-10 MED ORDER — SUGAMMADEX SODIUM 500 MG/5ML IV SOLN
INTRAVENOUS | Status: DC | PRN
  Administered 2021-03-10: 200 mg via INTRAVENOUS

## 2021-03-10 MED ORDER — CEFAZOLIN SODIUM-DEXTROSE 2-4 GM/100ML-% IV SOLN
2.0000 g | INTRAVENOUS | Status: AC
  Administered 2021-03-10: 2 g via INTRAVENOUS

## 2021-03-10 MED ORDER — ROCURONIUM BROMIDE 100 MG/10ML IV SOLN
INTRAVENOUS | Status: DC | PRN
  Administered 2021-03-10: 60 mg via INTRAVENOUS

## 2021-03-10 MED ORDER — BUPIVACAINE-EPINEPHRINE (PF) 0.5% -1:200000 IJ SOLN
INTRAMUSCULAR | Status: AC
  Filled 2021-03-10: qty 30

## 2021-03-10 MED ORDER — MIDAZOLAM HCL 2 MG/2ML IJ SOLN
INTRAMUSCULAR | Status: DC | PRN
  Administered 2021-03-10: 2 mg via INTRAVENOUS

## 2021-03-10 MED ORDER — OXYCODONE HCL 5 MG PO TABS: 5.0000 mg | ORAL_TABLET | Freq: Once | ORAL | Status: DC | PRN

## 2021-03-10 MED ORDER — OXYCODONE HCL 5 MG/5ML PO SOLN: 5.0000 mg | Freq: Once | ORAL | Status: DC | PRN

## 2021-03-10 MED ORDER — ACETAMINOPHEN 500 MG PO TABS: 1000.0000 mg | ORAL_TABLET | ORAL | Status: AC

## 2021-03-10 MED ORDER — BUPIVACAINE-EPINEPHRINE (PF) 0.5% -1:200000 IJ SOLN
INTRAMUSCULAR | Status: DC | PRN
  Administered 2021-03-10: 50 mL

## 2021-03-10 MED ORDER — LIDOCAINE HCL (CARDIAC) PF 100 MG/5ML IV SOSY
PREFILLED_SYRINGE | INTRAVENOUS | Status: DC | PRN
  Administered 2021-03-10: 80 mg via INTRAVENOUS

## 2021-03-10 SURGICAL SUPPLY — 57 items
ADH SKN CLS APL DERMABOND .7 (GAUZE/BANDAGES/DRESSINGS) ×1
BLADE SURG SZ11 CARB STEEL (BLADE) ×2 IMPLANT
CANNULA REDUC XI 12-8 STAPL (CANNULA) ×1
CANNULA REDUCER 12-8 DVNC XI (CANNULA) ×1 IMPLANT
COVER TIP SHEARS 8 DVNC (MISCELLANEOUS) ×1 IMPLANT
COVER TIP SHEARS 8MM DA VINCI (MISCELLANEOUS) ×1
DEFOGGER SCOPE WARMER CLEARIFY (MISCELLANEOUS) ×2 IMPLANT
DERMABOND ADVANCED (GAUZE/BANDAGES/DRESSINGS) ×1
DERMABOND ADVANCED .7 DNX12 (GAUZE/BANDAGES/DRESSINGS) ×1 IMPLANT
DRAPE ARM DVNC X/XI (DISPOSABLE) ×3 IMPLANT
DRAPE COLUMN DVNC XI (DISPOSABLE) ×1 IMPLANT
DRAPE DA VINCI XI ARM (DISPOSABLE) ×3
DRAPE DA VINCI XI COLUMN (DISPOSABLE) ×1
ELECT CAUTERY BLADE TIP 2.5 (TIP) ×2
ELECT REM PT RETURN 9FT ADLT (ELECTROSURGICAL) ×2
ELECTRODE CAUTERY BLDE TIP 2.5 (TIP) ×1 IMPLANT
ELECTRODE REM PT RTRN 9FT ADLT (ELECTROSURGICAL) ×1 IMPLANT
GLOVE SURG SYN 7.0 (GLOVE) ×4 IMPLANT
GLOVE SURG SYN 7.0 PF PI (GLOVE) ×2 IMPLANT
GLOVE SURG SYN 7.5  E (GLOVE) ×2
GLOVE SURG SYN 7.5 E (GLOVE) ×2 IMPLANT
GLOVE SURG SYN 7.5 PF PI (GLOVE) ×2 IMPLANT
GOWN STRL REUS W/ TWL LRG LVL3 (GOWN DISPOSABLE) ×3 IMPLANT
GOWN STRL REUS W/TWL LRG LVL3 (GOWN DISPOSABLE) ×6
GRASPER SUT TROCAR 14GX15 (MISCELLANEOUS) ×2 IMPLANT
IRRIGATION STRYKERFLOW (MISCELLANEOUS) IMPLANT
IRRIGATOR STRYKERFLOW (MISCELLANEOUS)
IV NS 1000ML (IV SOLUTION)
IV NS 1000ML BAXH (IV SOLUTION) IMPLANT
KIT PINK PAD W/HEAD ARE REST (MISCELLANEOUS) ×2
KIT PINK PAD W/HEAD ARM REST (MISCELLANEOUS) ×1 IMPLANT
MANIFOLD NEPTUNE II (INSTRUMENTS) ×2 IMPLANT
MESH VENT LT ST 11.4CM CRL (Mesh General) ×1 IMPLANT
NDL INSUFFLATION 14GA 120MM (NEEDLE) ×1 IMPLANT
NEEDLE HYPO 22GX1.5 SAFETY (NEEDLE) ×2 IMPLANT
NEEDLE INSUFFLATION 14GA 120MM (NEEDLE) ×2 IMPLANT
NS IRRIG 500ML POUR BTL (IV SOLUTION) ×1 IMPLANT
OBTURATOR OPTICAL STANDARD 8MM (TROCAR) ×1
OBTURATOR OPTICAL STND 8 DVNC (TROCAR) ×1
OBTURATOR OPTICALSTD 8 DVNC (TROCAR) ×1 IMPLANT
PACK LAP CHOLECYSTECTOMY (MISCELLANEOUS) ×2 IMPLANT
PENCIL ELECTRO HAND CTR (MISCELLANEOUS) ×2 IMPLANT
SEAL CANN UNIV 5-8 DVNC XI (MISCELLANEOUS) ×2 IMPLANT
SEAL XI 5MM-8MM UNIVERSAL (MISCELLANEOUS) ×2
SET TUBE SMOKE EVAC HIGH FLOW (TUBING) ×2 IMPLANT
SOLUTION ELECTROLUBE (MISCELLANEOUS) ×2 IMPLANT
STAPLER CANNULA SEAL DVNC XI (STAPLE) ×1 IMPLANT
STAPLER CANNULA SEAL XI (STAPLE) ×1
SUT MNCRL 4-0 (SUTURE) ×4
SUT MNCRL 4-0 27XMFL (SUTURE) ×2
SUT STRATAFIX PDS 30 CT-1 (SUTURE) ×2 IMPLANT
SUT VIC AB 3-0 SH 27 (SUTURE) ×2
SUT VIC AB 3-0 SH 27X BRD (SUTURE) IMPLANT
SUT VICRYL 0 AB UR-6 (SUTURE) ×4 IMPLANT
SUT VLOC 90 2/L VL 12 GS22 (SUTURE) ×4 IMPLANT
SUTURE MNCRL 4-0 27XMF (SUTURE) ×1 IMPLANT
WAND RF SURG SPNG DETECT SYS (INSTRUMENTS) ×2 IMPLANT

## 2021-03-10 NOTE — Anesthesia Preprocedure Evaluation (Addendum)
Anesthesia Evaluation  Patient identified by MRN, date of birth, ID band Patient awake    Reviewed: Allergy & Precautions, NPO status , Patient's Chart, lab work & pertinent test results  History of Anesthesia Complications Negative for: history of anesthetic complications  Airway Mallampati: III   Neck ROM: Full    Dental no notable dental hx.    Pulmonary neg pulmonary ROS,    Pulmonary exam normal breath sounds clear to auscultation       Cardiovascular Exercise Tolerance: Good Normal cardiovascular exam Rhythm:Regular Rate:Normal  ECG 08/13/20: normal   Neuro/Psych negative neurological ROS     GI/Hepatic GERD  ,  Endo/Other  Prediabetes   Renal/GU Renal disease (nephrolithiasis)     Musculoskeletal   Abdominal   Peds  Hematology negative hematology ROS (+)   Anesthesia Other Findings Reviewed 08/13/20 cardiology note.  Reproductive/Obstetrics                           Anesthesia Physical Anesthesia Plan  ASA: 2  Anesthesia Plan: General   Post-op Pain Management:    Induction: Intravenous  PONV Risk Score and Plan: 2 and Ondansetron, Dexamethasone and Treatment may vary due to age or medical condition  Airway Management Planned: Oral ETT  Additional Equipment:   Intra-op Plan:   Post-operative Plan: Extubation in OR  Informed Consent: I have reviewed the patients History and Physical, chart, labs and discussed the procedure including the risks, benefits and alternatives for the proposed anesthesia with the patient or authorized representative who has indicated his/her understanding and acceptance.     Dental advisory given  Plan Discussed with: CRNA  Anesthesia Plan Comments: (Patient consented for risks of anesthesia including but not limited to:  - adverse reactions to medications - damage to eyes, teeth, lips or other oral mucosa - nerve damage due to  positioning  - sore throat or hoarseness - damage to heart, brain, nerves, lungs, other parts of body or loss of life  Informed patient about role of CRNA in peri- and intra-operative care.  Patient voiced understanding.)        Anesthesia Quick Evaluation

## 2021-03-10 NOTE — Interval H&P Note (Signed)
History and Physical Interval Note:  03/10/2021 1:20 PM  Richard Adkins  has presented today for surgery, with the diagnosis of umbilical hernia.  The various methods of treatment have been discussed with the patient and family. After consideration of risks, benefits and other options for treatment, the patient has consented to  Procedure(s): XI [place] (N/A) as a surgical intervention.  The patient's history has been reviewed, patient examined, no change in status, stable for surgery.  I have reviewed the patient's chart and labs.  Questions were answered to the patient's satisfaction.     Jesiel Garate

## 2021-03-10 NOTE — Transfer of Care (Signed)
Immediate Anesthesia Transfer of Care Note  Patient: Richard Adkins  Procedure(s) Performed: XI ROBOT ASSISTED UMBILICAL HERNIA REPAIR (Abdomen)  Patient Location: PACU  Anesthesia Type:General  Level of Consciousness: awake  Airway & Oxygen Therapy: Patient Spontanous Breathing  Post-op Assessment: Report given to RN  Post vital signs: stable  Last Vitals:  Vitals Value Taken Time  BP    Temp    Pulse    Resp    SpO2      Last Pain:  Vitals:   03/10/21 1236  TempSrc: Oral  PainSc: 0-No pain         Complications: No notable events documented.

## 2021-03-10 NOTE — Anesthesia Procedure Notes (Signed)
Procedure Name: Intubation Date/Time: 03/10/2021 1:43 PM Performed by: Kerri Perches, CRNA Pre-anesthesia Checklist: Patient identified, Patient being monitored, Timeout performed, Emergency Drugs available and Suction available Patient Re-evaluated:Patient Re-evaluated prior to induction Oxygen Delivery Method: Circle system utilized Preoxygenation: Pre-oxygenation with 100% oxygen Induction Type: IV induction Ventilation: Mask ventilation without difficulty Laryngoscope Size: Mac and 3 Grade View: Grade II Tube type: Oral Tube size: 7.0 mm Number of attempts: 1 Airway Equipment and Method: Stylet Placement Confirmation: ETT inserted through vocal cords under direct vision, positive ETCO2 and breath sounds checked- equal and bilateral Secured at: 21 cm Tube secured with: Tape Dental Injury: Teeth and Oropharynx as per pre-operative assessment

## 2021-03-10 NOTE — Anesthesia Postprocedure Evaluation (Signed)
Anesthesia Post Note  Patient: Richardson Dopp  Procedure(s) Performed: XI ROBOT ASSISTED UMBILICAL HERNIA REPAIR (Abdomen)  Patient location during evaluation: PACU Anesthesia Type: General Level of consciousness: awake and alert Pain management: pain level controlled Vital Signs Assessment: post-procedure vital signs reviewed and stable Respiratory status: spontaneous breathing, nonlabored ventilation, respiratory function stable and patient connected to nasal cannula oxygen Cardiovascular status: blood pressure returned to baseline and stable Postop Assessment: no apparent nausea or vomiting Anesthetic complications: no   No notable events documented.   Last Vitals:  Vitals:   03/10/21 1624 03/10/21 1651  BP: (!) 127/95 (!) 132/93  Pulse: 68 75  Resp: 16 16  Temp: (!) 36.1 C   SpO2: 98% 98%    Last Pain:  Vitals:   03/10/21 1651  TempSrc:   PainSc: 3                  Martha Clan

## 2021-03-10 NOTE — Op Note (Addendum)
°  Procedure Date:  03/10/2021  Pre-operative Diagnosis:  Reducible umbilical hernia  Post-operative Diagnosis:  Reducible umbilical hernia, 2 cm defect  Procedure:  Robotic assisted umbilical Hernia Repair with mesh  Surgeon:  Melvyn Neth, MD  Assistant:  Rosita Fire, PA-S  Anesthesia:  General endotracheal  Estimated Blood Loss:  15 ml  Specimens:  None  Complications:  None  Indications for Procedure:  This is a 57 y.o. male who presents with a reducible umbilical hernia.  He also has diastasis recti, but has elected not to plicate it.  The options of surgery versus observation were reviewed with the patient and/or family. The risks of bleeding, abscess or infection, recurrence of symptoms, potential for an open procedure, injury to surrounding structures, and chronic pain were all discussed with the patient and was willing to proceed.  Description of Procedure: The patient was correctly identified in the preoperative area and brought into the operating room.  The patient was placed supine with VTE prophylaxis in place.  Appropriate time-outs were performed.  Anesthesia was induced and the patient was intubated.  Appropriate antibiotics were infused.  The abdomen was prepped and draped in a sterile fashion. The patient's hernia defect was marked with a marking pen.  A Veress needle was introduced in the left upper quadrant and pneumoperitoneum was obtained with appropriate pressures.  Using Optiview technique, an 8 mm port was introduced in the left lateral abdominal wall without complications.  Then, a 12 mm port was introduced in the left upper quadrant and an 8 mm port in the left lower quadrant under direct visualization.  A 4.5 inch Bard Ventralight ST Echo mesh, a ruler, a 0 Stratafix suture, and two 2-0 V-loc sutures were inserted through the 12 mm port under direct visualization.  The DaVinci platform was docked, camera targeted, and instruments placed under direct  visualization.  The patient's hernia was fully reduced and the peritoneum and preperitoneal fat were dissected and resected to allow better exposure of the hernia defect and for better mesh placement.  The defect measured 2 cm.  The hernia defect was closed using the stratafix suture.  A PMI was brought through the center of the hernia defect and the positioning system of the mesh was passed through and insufflated.  This allowed the mesh to splay open and be centered over the repair site with good overlap.  The mesh was then sutured in place circumferentially and through the center of the mesh using the V-loc sutures.  All needles, ruler, and the positioning system were then removed through the 12 mm port without complications.  The DaVinci platform was then undocked and instruments removed.    50 ml of Exparel solution mixed with 0.5% bupivacaine with epi was infiltrated around the mesh edges, hernia repair site, and port sites.  The 12 mm port was removed and the fascia was closed under direct visualization utilizing an Endo Close technique with 0 Vicryl suture.  The 8 mm ports were removed. The 12 mm incision was closed using 3-0 Vicryl and 4-0 Monocryl, and the other port incisions were closed with 4-0 Monocryl.  The wounds were cleaned and sealed with DermaBond.  The patient was emerged from anesthesia and extubated and brought to the recovery room for further management.  The patient tolerated the procedure well and all counts were correct at the end of the case.   Melvyn Neth, MD

## 2021-03-10 NOTE — Discharge Instructions (Addendum)
AMBULATORY SURGERY  DISCHARGE INSTRUCTIONS   The drugs that you were given will stay in your system until tomorrow so for the next 24 hours you should not:  Drive an automobile Make any legal decisions Drink any alcoholic beverage   You may resume regular meals tomorrow.  Today it is better to start with liquids and gradually work up to solid foods.  You may eat anything you prefer, but it is better to start with liquids, then soup and crackers, and gradually work up to solid foods.   Please notify your doctor immediately if you have any unusual bleeding, trouble breathing, redness and pain at the surgery site, drainage, fever, or pain not relieved by medication.    Additional Instructions:  keep green band on for 4 days Alternate tylenol 1072m  first dose at 9:00  with ibuprofen 8029meach every 8 hours  Please contact your physician with any problems or Same Day Surgery at [phone] through Friday 6 am to 4 pm, or [facility] at [phone]

## 2021-03-25 ENCOUNTER — Other Ambulatory Visit: Payer: Self-pay

## 2021-03-25 ENCOUNTER — Ambulatory Visit (INDEPENDENT_AMBULATORY_CARE_PROVIDER_SITE_OTHER): Payer: 59 | Admitting: Surgery

## 2021-03-25 ENCOUNTER — Encounter: Payer: Self-pay | Admitting: Surgery

## 2021-03-25 VITALS — BP 127/86 | HR 80 | Temp 98.5°F | Ht 69.0 in | Wt 198.8 lb

## 2021-03-25 DIAGNOSIS — Z09 Encounter for follow-up examination after completed treatment for conditions other than malignant neoplasm: Secondary | ICD-10-CM | POA: Diagnosis not present

## 2021-03-25 DIAGNOSIS — K429 Umbilical hernia without obstruction or gangrene: Secondary | ICD-10-CM

## 2021-03-25 NOTE — Progress Notes (Signed)
03/25/2021  History of Present Illness: Richard Adkins is a 56 y.o. male s/p robotic assisted umbilical hernia repair on 03/10/21.  Patient presents for follow up.  Reports that he's doing well.  Initially had some pain issues for the first few days but now doing well and feels back to baseline.  No issues with the incisions.  Tolerating a diet, having bowel function.  Past Medical History: Past Medical History:  Diagnosis Date   Allergy    Arterioloscleroses 2017   Borderline diabetes    Colitis    GERD (gastroesophageal reflux disease)    History of kidney stones    Hyperlipidemia      Past Surgical History: Past Surgical History:  Procedure Laterality Date   COLONOSCOPY WITH PROPOFOL N/A 09/26/2015   Procedure: COLONOSCOPY WITH PROPOFOL;  Surgeon: Lucilla Lame, MD;  Location: [place];  Service: Endoscopy;  Laterality: N/A;  requests early   COLONOSCOPY WITH PROPOFOL N/A 05/27/2020   Procedure: COLONOSCOPY WITH PROPOFOL;  Surgeon: Lucilla Lame, MD;  [facility] ENDOSCOPY;  Service: Endoscopy;  Laterality: N/A;   HEMORRHOID SURGERY     LASIK     POLYPECTOMY  09/26/2015   Procedure: POLYPECTOMY;  Surgeon: Lucilla Lame, MD;  Location: [place];  Service: Endoscopy;;    Home Medications: Prior to Admission medications   Medication Sig Start Date End Date Taking? Authorizing Provider  aspirin EC 81 MG tablet Take 81 mg by mouth daily.   Yes [provider]  atorvastatin (LIPITOR) 80 MG tablet TAKE 1 TABLET BY MOUTH EVERY DAY Patient taking differently: Take 80 mg by mouth at bedtime. 05/14/20  Yes Jerrol Banana., MD  ezetimibe (ZETIA) 10 MG tablet Take 1 tablet (10 mg total) by mouth daily. Patient taking differently: Take 10 mg by mouth at bedtime. 08/13/20  Yes Gollan, Kathlene November, MD  fluticasone (FLONASE) 50 MCG/ACT nasal spray SPRAY 1 SPRAY INTO BOTH NOSTRILS DAILY. 06/25/20  Yes Jerrol Banana., MD  icosapent Ethyl (VASCEPA) 1 g capsule  TAKE 1 CAPSULE (1 G TOTAL) BY MOUTH 2 (TWO) TIMES DAILY. Patient taking differently: 2 g daily. TAKE 1 CAPSULE (1 G TOTAL) BY MOUTH 2 (TWO) TIMES DAILY. 02/05/21  Yes Myles Gip, DO  mesalamine (LIALDA) 1.2 g EC tablet Take 2 tablets (2.4 g total) by mouth daily with breakfast. 06/10/20  Yes Lucilla Lame, MD  Multiple Vitamin (MULTIVITAMIN) capsule Take 1 capsule by mouth daily.   Yes [provider]  pantoprazole (PROTONIX) 40 MG tablet TAKE 1 TABLET BY MOUTH EVERY DAY Patient taking differently: 40 mg at bedtime. 10/27/20  Yes Jerrol Banana., MD  Probiotic Product (PROBIOTIC DAILY PO) Take 1 capsule by mouth daily.   Yes [provider]    Allergies: No Known Allergies  Review of Systems: Review of Systems  Constitutional:  Negative for chills and fever.  Respiratory:  Negative for shortness of breath.   Cardiovascular:  Negative for chest pain.  Gastrointestinal:  Negative for abdominal pain, nausea and vomiting.  Skin:  Negative for rash.   Physical Exam BP 127/86    Pulse 80    Temp 98.5 F (36.9 C) (Oral)    Ht 5' 9"  (1.753 m)    Wt 198 lb 12.8 oz (90.2 kg)    SpO2 97%    BMI 29.36 kg/m  CONSTITUTIONAL: No acute distress, well nourished. HEENT:  Normocephalic, atraumatic, extraocular motion intact. RESPIRATORY:  Normal respiratory effort without pathologic use of accessory  muscles. CARDIOVASCULAR: Regular rhythm and rate. GI: The abdomen is soft, non-distended, non-tender to palpation.  Incisions are healing well and are clean, dry, intact.  No evidence of hernia recurrence. NEUROLOGIC:  Motor and sensation is grossly normal.  Cranial nerves are grossly intact. PSYCH:  Alert and oriented to person, place and time. Affect is normal.   Assessment and Plan: This is a 56 y.o. male s/p robotic assisted umbilical hernia repair.  --Patient is doing well, without complications and healing appropriately. --Discussed activity restrctions for 4 weeks  total. --Follow up as needed.   I spent 25 minutes dedicated to the care of this patient on the date of this encounter to include pre-visit review of records, face-to-face time with the patient discussing diagnosis and management, and any post-visit coordination of care.   Melvyn Neth, [facility]

## 2021-03-25 NOTE — Patient Instructions (Addendum)
If you have any concerns or questions, please feel free to call our office. Follow up as needed.    GENERAL POST-OPERATIVE PATIENT INSTRUCTIONS   WOUND CARE INSTRUCTIONS:  Keep a dry clean dressing on the wound if there is drainage. The initial bandage may be removed after 24 hours.  Once the wound has quit draining you may leave it open to air.  If clothing rubs against the wound or causes irritation and the wound is not draining you may cover it with a dry dressing during the daytime.  Try to keep the wound dry and avoid ointments on the wound unless directed to do so.  If the wound becomes bright red and painful or starts to drain infected material that is not clear, please contact your physician immediately.  If the wound is mildly pink and has a thick firm ridge underneath it, this is normal, and is referred to as a healing ridge.  This will resolve over the next 4-6 weeks.  BATHING: You may shower if you have been informed of this by your surgeon. However, Please do not submerge in a tub, hot tub, or pool until incisions are completely sealed or have been told by your surgeon that you may do so.  DIET:  You may eat any foods that you can tolerate.  It is a good idea to eat a high fiber diet and take in plenty of fluids to prevent constipation.  If you do become constipated you may want to take a mild laxative or take ducolax tablets on a daily basis until your bowel habits are regular.  Constipation can be very uncomfortable, along with straining, after recent surgery.  ACTIVITY:  You are encouraged to cough and deep breath or use your incentive spirometer if you were given one, every 15-30 minutes when awake.  This will help prevent respiratory complications and low grade fevers post-operatively if you had a general anesthetic.  You may want to hug a pillow when coughing and sneezing to add additional support to the surgical area, if you had abdominal or chest surgery, which will decrease pain  during these times.  You are encouraged to walk and engage in light activity for the next two weeks.  You should not lift, push or pull more than 15-20 pounds, until 04/21/2021 as it could put you at increased risk for complications.  Twenty pounds is roughly equivalent to a plastic bag of groceries. At that time- Listen to your body when lifting, if you have pain when lifting, stop and then try again in a few days. Soreness after doing exercises or activities of daily living is normal as you get back in to your normal routine.  MEDICATIONS:  Try to take narcotic medications and anti-inflammatory medications, such as tylenol, ibuprofen, naprosyn, etc., with food.  This will minimize stomach upset from the medication.  Should you develop nausea and vomiting from the pain medication, or develop a rash, please discontinue the medication and contact your physician.  You should not drive, make important decisions, or operate machinery when taking narcotic pain medication.  SUNBLOCK Use sun block to incision area over the next year if this area will be exposed to sun. This helps decrease scarring and will allow you avoid a permanent darkened area over your incision.  QUESTIONS:  Please feel free to call our office if you have any questions, and we will be glad to assist you.

## 2021-03-29 ENCOUNTER — Other Ambulatory Visit: Payer: Self-pay | Admitting: Family Medicine

## 2021-03-29 DIAGNOSIS — E785 Hyperlipidemia, unspecified: Secondary | ICD-10-CM

## 2021-03-31 NOTE — Telephone Encounter (Signed)
Requested medications are due for refill today.  unsure  Requested medications are on the active medications list.  yes  Last refill. 02/05/2021 #180 3 refills  Future visit scheduled.   yes  Notes to clinic.  No protocol assigned. Pt should have ample refills.    Requested Prescriptions  Pending Prescriptions Disp Refills   VASCEPA 1 g capsule [Pharmacy Med Name: VASCEPA 1 GM CAPSULE] 180 capsule 3    Sig: TAKE 1 CAPSULE BY MOUTH 2 TIMES DAILY.     Off-Protocol Failed - 03/29/2021  6:00 PM      Failed - Medication not assigned to a protocol, review manually.      Passed - Valid encounter within last 12 months    Recent Outpatient Visits           5 months ago Hyperlipidemia, unspecified hyperlipidemia type   [facility] Jerrol Banana., MD   11 months ago Annual physical exam   [facility] Jerrol Banana., MD   1 year ago Annual physical exam   [facility] Jerrol Banana., MD   3 years ago Encounter for annual physical exam   [facility] Jerrol Banana., MD   3 years ago Need for immunization against influenza   [facility], Dionne Bucy, MD       Future Appointments             In 3 weeks Jerrol Banana., MD [facility], [place]

## 2021-04-22 ENCOUNTER — Ambulatory Visit: Payer: 59 | Admitting: Dermatology

## 2021-04-23 ENCOUNTER — Encounter: Payer: Self-pay | Admitting: Family Medicine

## 2021-04-23 ENCOUNTER — Ambulatory Visit (INDEPENDENT_AMBULATORY_CARE_PROVIDER_SITE_OTHER): Payer: 59 | Admitting: Family Medicine

## 2021-04-23 ENCOUNTER — Other Ambulatory Visit: Payer: Self-pay

## 2021-04-23 VITALS — BP 132/87 | HR 81 | Resp 15 | Ht 69.0 in | Wt 199.7 lb

## 2021-04-23 DIAGNOSIS — Z125 Encounter for screening for malignant neoplasm of prostate: Secondary | ICD-10-CM

## 2021-04-23 DIAGNOSIS — K219 Gastro-esophageal reflux disease without esophagitis: Secondary | ICD-10-CM

## 2021-04-23 DIAGNOSIS — J309 Allergic rhinitis, unspecified: Secondary | ICD-10-CM

## 2021-04-23 DIAGNOSIS — R7303 Prediabetes: Secondary | ICD-10-CM

## 2021-04-23 DIAGNOSIS — Z0000 Encounter for general adult medical examination without abnormal findings: Secondary | ICD-10-CM | POA: Diagnosis not present

## 2021-04-23 DIAGNOSIS — E785 Hyperlipidemia, unspecified: Secondary | ICD-10-CM

## 2021-04-23 DIAGNOSIS — I2510 Atherosclerotic heart disease of native coronary artery without angina pectoris: Secondary | ICD-10-CM

## 2021-04-23 DIAGNOSIS — Z1389 Encounter for screening for other disorder: Secondary | ICD-10-CM

## 2021-04-23 LAB — POCT URINALYSIS DIPSTICK
Bilirubin, UA: NEGATIVE
Blood, UA: NEGATIVE
Glucose, UA: NEGATIVE
Ketones, UA: NEGATIVE
Leukocytes, UA: NEGATIVE
Nitrite, UA: NEGATIVE
Protein, UA: NEGATIVE
Spec Grav, UA: 1.01 (ref 1.010–1.025)
Urobilinogen, UA: 0.2 E.U./dL
pH, UA: 6 (ref 5.0–8.0)

## 2021-04-23 MED ORDER — CETIRIZINE HCL 10 MG PO CHEW: 10.0000 mg | CHEWABLE_TABLET | Freq: Every day | ORAL | 5 refills | Status: AC | PRN

## 2021-04-23 NOTE — Progress Notes (Signed)
? ? ? ?Complete physical exam ? ? ?Patient: Richard Adkins   DOB: [birth year]   56 y.o. Male  MRN: [id number] ?Visit Date: 04/23/2021 ? ?Today's healthcare provider: [name], MD  ? ?Chief Complaint  ?Patient presents with  ? Annual Exam  ? ?Subjective  ?  ?Richard Adkins is a 56 y.o. male who presents today for a complete physical exam.  ?He reports consuming a general diet. The patient does not participate in regular exercise at present. He generally feels well. He reports sleeping fairly well. He does not have additional problems to discuss today.  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  His daughter is starting [employer] to get to get her masters in architecture in the fall and his son is a Museum/gallery exhibitions officer at [employer] ?HPI  ? ? ?Past Medical History:  ?Diagnosis Date  ? Allergy   ? Arterioloscleroses 2017  ? Borderline diabetes   ? Colitis   ? GERD (gastroesophageal reflux disease)   ? History of kidney stones   ? Hyperlipidemia   ? ?Past Surgical History:  ?Procedure Laterality Date  ? COLONOSCOPY WITH PROPOFOL N/A 09/26/2015  ? Procedure: COLONOSCOPY WITH PROPOFOL;  Surgeon: Lucilla Lame, MD;  Location: [place];  Service: Endoscopy;  Laterality: N/A;  requests early  ? COLONOSCOPY WITH PROPOFOL N/A 05/27/2020  ? Procedure: COLONOSCOPY WITH PROPOFOL;  Surgeon: Lucilla Lame, MD;  Location: [facility] ENDOSCOPY;  Service: Endoscopy;  Laterality: N/A;  ? HEMORRHOID SURGERY    ? LASIK    ? POLYPECTOMY  09/26/2015  ? Procedure: POLYPECTOMY;  Surgeon: Lucilla Lame, MD;  Location: [place];  Service: Endoscopy;;  ? ?Social History  ? ?Socioeconomic History  ? Marital status: Married  ?  Spouse name: Not on file  ? Number of children: 2  ? Years of education: Not on file  ? Highest education level: Not on file  ?Occupational History  ? Not on file  ?Tobacco Use  ? Smoking status: Never  ? Smokeless tobacco: Never  ?Vaping Use  ? Vaping Use: Never used  ?Substance and Sexual Activity  ?  Alcohol use: Yes  ?  Alcohol/week: 3.0 standard drinks  ?  Types: 3 Cans of beer per week  ?  Comment: drinks beer on weekends  ? Drug use: No  ? Sexual activity: Yes  ?  Birth control/protection: None  ?Other Topics Concern  ? Not on file  ?Social History Narrative  ? Not on file  ? ?Social Determinants of Health  ? ?Financial Resource Strain: Not on file  ?Food Insecurity: Not on file  ?Transportation Needs: Not on file  ?Physical Activity: Not on file  ?Stress: Not on file  ?Social Connections: Not on file  ?Intimate Partner Violence: Not on file  ? ?Family Status  ?Relation Name Status  ? Mother  Alive  ? Father  Deceased at age 81  ? Brother  Alive  ? Brother  Alive  ? Brother  Deceased at age 23  ? ?Family History  ?Problem Relation Age of Onset  ? Hyperlipidemia Mother   ? Hypertension Mother   ? Diabetes Mother   ? Heart disease Mother   ? Sarcoidosis Father   ? Hyperlipidemia Brother   ? Hyperlipidemia Brother   ? Heart attack Brother   ? ?No Known Allergies  ?Patient Care Team: ?Jerrol Banana., MD as PCP - General (Family Medicine)  ? ?Medications: ?Outpatient Medications Prior to Visit  ?Medication Sig  ?  aspirin EC 81 MG tablet Take 81 mg by mouth daily.  ? atorvastatin (LIPITOR) 80 MG tablet TAKE 1 TABLET BY MOUTH EVERY DAY (Patient taking differently: Take 80 mg by mouth at bedtime.)  ? ezetimibe (ZETIA) 10 MG tablet Take 1 tablet (10 mg total) by mouth daily. (Patient taking differently: Take 10 mg by mouth at bedtime.)  ? fluticasone (FLONASE) 50 MCG/ACT nasal spray SPRAY 1 SPRAY INTO BOTH NOSTRILS DAILY.  ? icosapent Ethyl (VASCEPA) 1 g capsule TAKE 1 CAPSULE (1 G TOTAL) BY MOUTH 2 (TWO) TIMES DAILY. (Patient taking differently: 2 g daily. TAKE 1 CAPSULE (1 G TOTAL) BY MOUTH 2 (TWO) TIMES DAILY.)  ? mesalamine (LIALDA) 1.2 g EC tablet Take 2 tablets (2.4 g total) by mouth daily with breakfast.  ? Multiple Vitamin (MULTIVITAMIN) capsule Take 1 capsule by mouth daily.  ? pantoprazole  (PROTONIX) 40 MG tablet TAKE 1 TABLET BY MOUTH EVERY DAY (Patient taking differently: 40 mg at bedtime.)  ? Probiotic Product (PROBIOTIC DAILY PO) Take 1 capsule by mouth daily.  ? ?No facility-administered medications prior to visit.  ? ? ?Review of Systems  ?HENT:  Positive for congestion, postnasal drip, sinus pressure, sneezing and sore throat.   ?Allergic/Immunologic: Positive for environmental allergies.  ?All other systems reviewed and are negative. ? ?Last lipids ?Lab Results  ?Component Value Date  ? CHOL 113 10/09/2020  ? HDL 28 (L) 10/09/2020  ? [place] 58 10/09/2020  ? TRIG 157 (H) 10/09/2020  ? CHOLHDL 4.0 10/09/2020  ? ?  ? Objective  ?  ?BP 132/87   Pulse 81   Resp 15   Ht 5' 9"  (1.753 m)   Wt 199 lb 11.2 oz (90.6 kg)   SpO2 97%   BMI 29.49 kg/m?  ?BP Readings from Last 3 Encounters:  ?04/23/21 132/87  ?03/25/21 127/86  ?03/10/21 (!) 132/93  ? ?Wt Readings from Last 3 Encounters:  ?04/23/21 199 lb 11.2 oz (90.6 kg)  ?03/25/21 198 lb 12.8 oz (90.2 kg)  ?03/10/21 200 lb (90.7 kg)  ? ?  ? ? ?Physical Exam ?Vitals reviewed.  ?Constitutional:   ?   Appearance: Normal appearance. He is well-developed.  ?HENT:  ?   Head: Normocephalic and atraumatic.  ?   Right Ear: Tympanic membrane and external ear normal.  ?   Left Ear: Tympanic membrane and external ear normal.  ?   Nose: Nose normal.  ?   Mouth/Throat:  ?   Pharynx: Oropharynx is clear.  ?Eyes:  ?   General: No scleral icterus. ?   Conjunctiva/sclera: Conjunctivae normal.  ?Neck:  ?   Thyroid: No thyromegaly.  ?Cardiovascular:  ?   Rate and Rhythm: Normal rate and regular rhythm.  ?   Heart sounds: Normal heart sounds.  ?Pulmonary:  ?   Effort: Pulmonary effort is normal.  ?Abdominal:  ?   Palpations: Abdomen is soft.  ?Genitourinary: ?   Penis: Normal.   ?   Testes: Normal.  ?   Comments: Right testicle normal.  Left testicle lower inguinal canal. ?Lymphadenopathy:  ?   Cervical: No cervical adenopathy.  ?Skin: ?   General: Skin is warm and dry.   ?Neurological:  ?   Mental Status: He is alert and oriented to person, place, and time.  ?Psychiatric:     ?   Mood and Affect: Mood normal.     ?   Behavior: Behavior normal.     ?   Thought Content: Thought content normal.     ?  Judgment: Judgment normal.  ?  ? ? ?Last depression screening scores ? ?  04/23/2021  ? 10:31 AM 04/08/2020  ?  9:18 AM 04/04/2019  ?  9:18 AM  ?PHQ 2/9 Scores  ?PHQ - 2 Score 0 0 0  ?PHQ- 9 Score 2 2 2   ? ?Last fall risk screening ? ?  03/30/2018  ?  9:15 AM  ?Fall Risk   ?Falls in the past year? 0  ? ?Last Audit-C alcohol use screening ? ?  04/08/2020  ?  9:17 AM  ?Alcohol Use Disorder Test (AUDIT)  ?1. How often do you have a drink containing alcohol? 2  ?2. How many drinks containing alcohol do you have on a typical day when you are drinking? 1  ?3. How often do you have six or more drinks on one occasion? 1  ?AUDIT-C Score 4  ?4. How often during the last year have you found that you were not able to stop drinking once you had started? 0  ?5. How often during the last year have you failed to do what was normally expected from you because of drinking? 0  ?6. How often during the last year have you needed a first drink in the morning to get yourself going after a heavy drinking session? 0  ?7. How often during the last year have you had a feeling of guilt of remorse after drinking? 0  ?8. How often during the last year have you been unable to remember what happened the night before because you had been drinking? 0  ?9. Have you or someone else been injured as a result of your drinking? 0  ?10. Has a relative or friend or a doctor or another health worker been concerned about your drinking or suggested you cut down? 0  ?Alcohol Use Disorder Identification Test Final Score (AUDIT) 4  ?Alcohol Brief Interventions/Follow-up AUDIT Score <7 follow-up not indicated  ? ?A score of 3 or more in women, and 4 or more in men indicates increased risk for alcohol abuse, EXCEPT if all of the points are  from question 1  ? ?  04/23/2021  ?  1:00 PM  ?Results of the Epworth flowsheet  ?Sitting and reading 2  ?Watching TV 2  ?Sitting, inactive in a public place (e.g. a theatre or a meeting) 1  ?As a passenger in a car for a

## 2021-04-23 NOTE — Patient Instructions (Signed)
TAKE PANTOPRAZOLE EVERY OTHER DAY. ?

## 2021-04-25 LAB — TSH: TSH: 1.05 u[IU]/mL (ref 0.450–4.500)

## 2021-04-25 LAB — LIPID PANEL
Chol/HDL Ratio: 3.9 ratio (ref 0.0–5.0)
Cholesterol, Total: 121 mg/dL (ref 100–199)
HDL: 31 mg/dL — ABNORMAL LOW (ref >39)
LDL Chol Calc (NIH): 64 mg/dL (ref 0–99)
Triglycerides: 147 mg/dL (ref 0–149)
VLDL Cholesterol Cal: 26 mg/dL (ref 5–40)

## 2021-04-25 LAB — PSA: Prostate Specific Ag, Serum: 0.5 ng/mL (ref 0.0–4.0)

## 2021-04-25 LAB — HEMOGLOBIN A1C
Est. average glucose Bld gHb Est-mCnc: 137 mg/dL
Hgb A1c MFr Bld: 6.4 % — ABNORMAL HIGH (ref 4.8–5.6)

## 2021-05-09 ENCOUNTER — Other Ambulatory Visit: Payer: Self-pay | Admitting: Family Medicine

## 2021-05-09 DIAGNOSIS — E782 Mixed hyperlipidemia: Secondary | ICD-10-CM

## 2021-06-06 ENCOUNTER — Other Ambulatory Visit: Payer: Self-pay | Admitting: Gastroenterology

## 2021-06-19 ENCOUNTER — Other Ambulatory Visit: Payer: Self-pay | Admitting: Family Medicine

## 2021-08-02 ENCOUNTER — Other Ambulatory Visit: Payer: Self-pay | Admitting: Cardiovascular Disease

## 2021-08-03 NOTE — Telephone Encounter (Signed)
Please schedule 12 month F/U appointment for 90 day refills. Thank you!

## 2021-08-05 NOTE — Telephone Encounter (Signed)
Patient not available wants a call back later in the week

## 2021-08-07 ENCOUNTER — Telehealth: Payer: Self-pay | Admitting: Cardiovascular Disease

## 2021-08-07 ENCOUNTER — Other Ambulatory Visit: Payer: Self-pay

## 2021-08-07 MED ORDER — EZETIMIBE 10 MG PO TABS: 10.0000 mg | ORAL_TABLET | Freq: Every day | ORAL | 0 refills | Status: DC

## 2021-08-07 NOTE — Telephone Encounter (Signed)
*  STAT* If patient is at the pharmacy, call can be transferred to refill team.   1. Which medications need to be refilled? (please list name of each medication and dose if known) ezetimibe (ZETIA) 10 MG tablet  2. Which pharmacy/location (including street and city if local pharmacy) is medication to be sent to? [id number] 3. Do they need a 30 day or 90 day supply? 9Paul

## 2021-08-07 NOTE — Telephone Encounter (Signed)
Patient called in about 88monthfu appt, he says he thought this was all taken cre of at his 03/23 appt. Please call back to discuss. He is trying to get refill of ezetimibe (ZETIA) 10 MG tablet.

## 2021-08-17 NOTE — Telephone Encounter (Signed)
Unable to reach patient.

## 2021-08-31 ENCOUNTER — Other Ambulatory Visit: Payer: Self-pay | Admitting: Family Medicine

## 2021-08-31 NOTE — Telephone Encounter (Unsigned)
Copied from [place]. Topic: General - Other >> Aug 31, 2021 11:36 AM Everette C wrote: Reason for CRM: Medication Refill - Medication: mesalamine (LIALDA) 1.2 g EC tablet [[id number]]   Has the patient contacted their pharmacy? Yes.  The patient has been directed to contact their PCP (Agent: If no, request that the patient contact the pharmacy for the refill. If patient does not wish to contact the pharmacy document the reason why and proceed with request.) (Agent: If yes, when and what did the pharmacy advise?)  Preferred Pharmacy (with phone number or street name): [id number] [address]: [phone]: [phone]: Not open 24 hours   Has the patient been seen for an appointment in the last year OR does the patient have an upcoming appointment? Yes.    Agent: Please be advised that RX refills may take up to 3 business days. We ask that you follow-up with your pharmacy.

## 2021-09-01 ENCOUNTER — Other Ambulatory Visit: Payer: Self-pay | Admitting: Gastroenterology

## 2021-09-01 NOTE — Telephone Encounter (Signed)
Requested medication (s) are due for refill today: yes  Requested medication (s) are on the active medication list: yes  Last refill:  06/08/21 #180 with 0 RF  Future visit scheduled: 04/29/2022, seen 04/2021  Notes to clinic:  prescriber not in this practice, please assess.      Requested Prescriptions  Pending Prescriptions Disp Refills   mesalamine (LIALDA) 1.2 g EC tablet 180 tablet 0    Sig: Take 2 tablets (2.4 g total) by mouth daily with breakfast. SCHEDULE OFFICE VISIT     Gastroenterology:  Inflammatory Bowel Disease (Oral) Failed - 08/31/2021 12:57 PM      Failed - AST in normal range and within 360 days    AST  Date Value Ref Range Status  04/08/2020 23 0 - 40 IU/L Final         Failed - ALT in normal range and within 360 days    ALT  Date Value Ref Range Status  04/08/2020 33 0 - 44 IU/L Final         Failed - CBC within normal limits and completed in the last 12 months    WBC  Date Value Ref Range Status  03/06/2021 6.6 4.0 - 10.5 K/uL Final   RBC  Date Value Ref Range Status  03/06/2021 5.21 4.22 - 5.81 MIL/uL Final   Hemoglobin  Date Value Ref Range Status  03/06/2021 15.3 13.0 - 17.0 g/dL Final  04/08/2020 16.1 13.0 - 17.7 g/dL Final   HCT  Date Value Ref Range Status  03/06/2021 46.3 39.0 - 52.0 % Final   Hematocrit  Date Value Ref Range Status  04/08/2020 46.5 37.5 - 51.0 % Final   MCHC  Date Value Ref Range Status  03/06/2021 33.0 30.0 - 36.0 g/dL Final   [facility]  Date Value Ref Range Status  03/06/2021 29.4 26.0 - 34.0 pg Final   MCV  Date Value Ref Range Status  03/06/2021 88.9 80.0 - 100.0 fL Final  04/08/2020 88 79 - 97 fL Final   No results found for: "PLTCOUNTKUC", "LABPLAT", "POCPLA" RDW  Date Value Ref Range Status  03/06/2021 12.8 11.5 - 15.5 % Final  04/08/2020 12.9 11.6 - 15.4 % Final         Passed - Cr in normal range and within 360 days    Creatinine, Ser  Date Value Ref Range Status  03/06/2021 0.87 0.61 - 1.24  mg/dL Final         Passed - Valid encounter within last 6 months    Recent Outpatient Visits           4 months ago Annual physical exam   [facility] Jerrol Banana., MD   10 months ago Hyperlipidemia, unspecified hyperlipidemia type   [facility] Jerrol Banana., MD   1 year ago Annual physical exam   [facility] Jerrol Banana., MD   2 years ago Annual physical exam   [facility] Jerrol Banana., MD   3 years ago Encounter for annual physical exam   [facility] Jerrol Banana., MD       Future Appointments             In 8 months Jerrol Banana., MD [facility] [place]

## 2021-09-07 ENCOUNTER — Telehealth: Payer: Self-pay | Admitting: Gastroenterology

## 2021-09-07 MED ORDER — MESALAMINE 1.2 G PO TBEC: 2.4000 g | DELAYED_RELEASE_TABLET | Freq: Every day | ORAL | 0 refills | Status: AC

## 2021-09-07 NOTE — Telephone Encounter (Signed)
Rx sent through e-scribe  

## 2021-09-07 NOTE — Telephone Encounter (Signed)
Patient called to schedule an appointment and is requesting a refill on medication (mesalamine).

## 2021-09-07 NOTE — Addendum Note (Signed)
Addended by: Lurlean Nanny on: 09/07/2021 01:30 PM   Modules accepted: Orders

## 2021-11-03 ENCOUNTER — Other Ambulatory Visit: Payer: Self-pay | Admitting: Cardiovascular Disease

## 2021-12-13 NOTE — Progress Notes (Unsigned)
Cardiology Office Note  Date:  12/14/2021   ID:  Richard Adkins, DOB 1966-01-15, MRN [id number]  PCP:  Gladstone Lighter, MD   Chief Complaint  Patient presents with   1 year follow up    "Doing well." Medications reviewed by the patient verbally.     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  Weight stable No regular exercise program  Tolerating current medications  Lab work reviewed  hemoglobin A1c 6.4 Total cholesterol 121, LDL 64  EKG personally reviewed by myself on todays visit Shows normal sinus rhythm rate 84 bpm RBBB  Strong family history coronary disease, brother with MI at early age  Imaging studies reviewed CT coronary calcium scoring March 2016 score 159, predominantly in the LAD Repeat calcium scoring March 2022 calcium score 391  PMH:   has a past medical history of Allergy, Arterioloscleroses (2017), Borderline diabetes, Colitis, GERD (gastroesophageal reflux disease), History of kidney stones, and Hyperlipidemia.  PSH:    Past Surgical History:  Procedure Laterality Date   COLONOSCOPY WITH PROPOFOL N/A 09/26/2015   Procedure: COLONOSCOPY WITH PROPOFOL;  Surgeon: Lucilla Lame, MD;  Location: [place];  Service: Endoscopy;  Laterality: N/A;  requests early   COLONOSCOPY WITH PROPOFOL N/A 05/27/2020   Procedure: COLONOSCOPY WITH PROPOFOL;  Surgeon: Lucilla Lame, MD;  Location[facility] ENDOSCOPY;  Service: Endoscopy;  Laterality: N/A;   HEMORRHOID SURGERY     LASIK     POLYPECTOMY  09/26/2015   Procedure: POLYPECTOMY;  Surgeon: Lucilla Lame, MD;  Location: [place];  Service: Endoscopy;;    Current Outpatient Medications   Medication Sig Dispense Refill   aspirin EC 81 MG tablet Take 81 mg by mouth daily.     atorvastatin (LIPITOR) 80 MG tablet Take 1 tablet (80 mg total) by mouth at bedtime. 90 tablet 3   ezetimibe (ZETIA) 10 MG tablet TAKE 1 TABLET BY MOUTH EVERY DAY 90 tablet 0   fluticasone (FLONASE) 50 MCG/ACT nasal spray SPRAY 1 SPRAY INTO BOTH NOSTRILS DAILY. 48 mL 2   icosapent Ethyl (VASCEPA) 1 g capsule TAKE 1 CAPSULE (1 G TOTAL) BY MOUTH 2 (TWO) TIMES DAILY. (Patient taking differently: 2 g daily. TAKE 1 CAPSULE (1 G TOTAL) BY MOUTH 2 (TWO) TIMES DAILY.) 180 capsule 3   mesalamine (LIALDA) 1.2 g EC tablet Take 2 tablets (2.4 g total) by mouth daily with breakfast. 180 tablet 0   Multiple Vitamin (MULTIVITAMIN) capsule Take 1 capsule by mouth daily.     pantoprazole (PROTONIX) 40 MG tablet TAKE 1 TABLET BY MOUTH EVERY DAY 90 tablet 1   Probiotic Product (PROBIOTIC DAILY PO) Take 1 capsule by mouth daily.     cetirizine (ZYRTEC) 10 MG chewable tablet Chew 1 tablet (10 mg total) by mouth daily as needed for allergies. (Patient not taking: Reported on 12/14/2021) 30 tablet 5   No current facility-administered medications for this visit.   Allergies:   Patient has no known allergies.   Social History:  The patient  reports that he has never smoked. He has never used smokeless tobacco. He reports current alcohol use of about 3.0 standard drinks of alcohol per week. He reports that he does  not use drugs.   Family History:   family history includes Diabetes in his mother; Heart attack in his brother; Heart disease in his mother; Hyperlipidemia in his brother, brother, and mother; Hypertension in his mother; Sarcoidosis in his father.    Review of Systems: Review of Systems  Constitutional: Negative.   HENT: Negative.    Respiratory: Negative.    Cardiovascular: Negative.   Gastrointestinal: Negative.   Musculoskeletal: Negative.   Neurological: Negative.   Psychiatric/Behavioral: Negative.    All  other systems reviewed and are negative.  PHYSICAL EXAM: VS:  BP 136/78 (BP Location: Left Arm, Patient Position: Sitting, Cuff Size: Normal)   Pulse 75   Ht 5' 9"  (1.753 m)   Wt 196 lb 8 oz (89.1 kg)   SpO2 97%   BMI 29.02 kg/m  , BMI Body mass index is 29.02 kg/m. Constitutional:  oriented to person, place, and time. No distress.  HENT:  Head: Grossly normal Eyes:  no discharge. No scleral icterus.  Neck: No JVD, no carotid bruits  Cardiovascular: Regular rate and rhythm, no murmurs appreciated Pulmonary/Chest: Clear to auscultation bilaterally, no wheezes or rails Abdominal: Soft.  no distension.  no tenderness.  Musculoskeletal: Normal range of motion Neurological:  normal muscle tone. Coordination normal. No atrophy Skin: Skin warm and dry Psychiatric: normal affect, pleasant  Recent Labs: 03/06/2021: BUN 16; Creatinine, Ser 0.87; Hemoglobin 15.3; Platelets 249; Potassium 3.8; Sodium 137 04/24/2021: TSH 1.050    Lipid Panel Lab Results  Component Value Date   CHOL 121 04/24/2021   HDL 31 (L) 04/24/2021   LDLCALC 64 04/24/2021   TRIG 147 04/24/2021      Wt Readings from Last 3 Encounters:  12/14/21 196 lb 8 oz (89.1 kg)  04/23/21 199 lb 11.2 oz (90.6 kg)  03/25/21 198 lb 12.8 oz (90.2 kg)     ASSESSMENT AND PLAN:  Problem List Items Addressed This Visit       Cardiology Problems   Mixed hyperlipidemia   Combined fat and carbohydrate induced hyperlipemia     Other   Dyslipidemia   Borderline diabetes   Other Visit Diagnoses     Coronary artery calcification seen on CAT scan    -  Primary      Coronary calcification Noted on CT scan 2016 and 2022 with increasing score Asymptomatic, no anginal symptoms Cholesterol at goal, A1c 6.4 We have encouraged continued exercise, careful diet management in an effort to lose weight.  Borderline diabetes We have encouraged continued exercise, careful diet management in an effort to lose  weight.  Hyperlipidemia Continue Lipitor 80 daily, Zetia 10 mg daily Cholesterol at goal   Total encounter time more than 30 minutes  Greater than 50% was spent in counseling and coordination of care with the patient    Signed, Esmond Plants, M.D., Ph.D. [place], [place]

## 2021-12-14 ENCOUNTER — Encounter: Payer: Self-pay | Admitting: Cardiovascular Disease

## 2021-12-14 ENCOUNTER — Ambulatory Visit: Payer: 59 | Attending: Cardiovascular Disease | Admitting: Cardiovascular Disease

## 2021-12-14 VITALS — BP 136/78 | HR 75 | Ht 69.0 in | Wt 196.5 lb

## 2021-12-14 DIAGNOSIS — I2510 Atherosclerotic heart disease of native coronary artery without angina pectoris: Secondary | ICD-10-CM

## 2021-12-14 DIAGNOSIS — R7303 Prediabetes: Secondary | ICD-10-CM

## 2021-12-14 DIAGNOSIS — E785 Hyperlipidemia, unspecified: Secondary | ICD-10-CM

## 2021-12-14 DIAGNOSIS — E782 Mixed hyperlipidemia: Secondary | ICD-10-CM | POA: Diagnosis not present

## 2021-12-14 MED ORDER — EZETIMIBE 10 MG PO TABS: 10.0000 mg | ORAL_TABLET | Freq: Every day | ORAL | 3 refills | Status: AC

## 2021-12-14 MED ORDER — ICOSAPENT ETHYL 1 G PO CAPS: ORAL_CAPSULE | ORAL | 3 refills | Status: AC

## 2021-12-14 MED ORDER — ATORVASTATIN CALCIUM 80 MG PO TABS: 80.0000 mg | ORAL_TABLET | Freq: Every evening | ORAL | 3 refills | Status: AC

## 2021-12-14 NOTE — Patient Instructions (Addendum)
Medication Instructions:  No changes  If you need a refill on your cardiac medications before your next appointment, please call your pharmacy.   Lab work: No new labs needed  Testing/Procedures: No new testing needed  Follow-Up: At [facility], you and your health needs are our priority.  As part of our continuing mission to provide you with exceptional heart care, we have created designated Provider Care Teams.  These Care Teams include your primary Cardiologist (physician) and Advanced Practice Providers (APPs -  Physician Assistants and Nurse Practitioners) who all work together to provide you with the care you need, when you need it.  You will need a follow up appointment in 12 months  Providers on your designated Care Team:   Christopher Berge, NP Ryan Dunn, PA-C Cadence Furth, PA-C  COVID-19 Vaccine Information can be found at: [email or web address] For questions related to vaccine distribution or appointments, please email [email or web address] or call [phone].   

## 2022-01-11 ENCOUNTER — Ambulatory Visit: Payer: 59 | Admitting: Gastroenterology

## 2022-03-04 ENCOUNTER — Other Ambulatory Visit: Payer: Self-pay | Admitting: Gastroenterology

## 2022-04-29 ENCOUNTER — Encounter: Payer: 59 | Admitting: Family Medicine

## 2022-09-08 ENCOUNTER — Telehealth: Payer: Self-pay

## 2022-09-08 DIAGNOSIS — Z207 Contact with and (suspected) exposure to pediculosis, acariasis and other infestations: Secondary | ICD-10-CM

## 2022-09-08 MED ORDER — PERMETHRIN 5 % EX CREA
TOPICAL_CREAM | CUTANEOUS | 1 refills | Status: AC
Start: 2022-09-08 — End: ?

## 2022-09-08 NOTE — Telephone Encounter (Signed)
Wife seen in office today for yearly check at [facility]. Reports her and husband came into contact with son who was treated for scabies over the weekend.   Wife is concerned that they may have scabies but not sure. Would like rx for preventative treatment  Will send rx of pre  Rx of  permethrin cream to [facility] at [address] Patient to use once on skin from neck down to feet (all skin ) at bedtime and wash off next day.   Repeat process in 1 week.
